# Patient Record
Sex: Male | Born: 1975
Health system: Southern US, Community
[De-identification: ages and names within clinical notes are randomized; demographics above are authoritative.]

## PROBLEM LIST (undated history)

## (undated) DIAGNOSIS — S29011A Strain of muscle and tendon of front wall of thorax, initial encounter: Secondary | ICD-10-CM

## (undated) DIAGNOSIS — T8859XA Other complications of anesthesia, initial encounter: Secondary | ICD-10-CM

## (undated) DIAGNOSIS — A4902 Methicillin resistant Staphylococcus aureus infection, unspecified site: Secondary | ICD-10-CM

## (undated) DIAGNOSIS — I10 Essential (primary) hypertension: Secondary | ICD-10-CM

## (undated) DIAGNOSIS — E119 Type 2 diabetes mellitus without complications: Secondary | ICD-10-CM

## (undated) DIAGNOSIS — G7000 Myasthenia gravis without (acute) exacerbation: Secondary | ICD-10-CM

## (undated) DIAGNOSIS — T4145XA Adverse effect of unspecified anesthetic, initial encounter: Secondary | ICD-10-CM

## (undated) HISTORY — DX: Essential (primary) hypertension: I10

## (undated) HISTORY — DX: Type 2 diabetes mellitus without complications: E11.9

---

## 1996-03-08 HISTORY — PX: THYMECTOMY: SHX1063

## 1998-06-09 ENCOUNTER — Ambulatory Visit (HOSPITAL_COMMUNITY): Admission: RE | Admit: 1998-06-09 | Discharge: 1998-06-09 | Payer: Self-pay | Admitting: Psychiatry

## 1998-07-24 ENCOUNTER — Ambulatory Visit (HOSPITAL_COMMUNITY): Admission: RE | Admit: 1998-07-24 | Discharge: 1998-07-24 | Payer: Self-pay | Admitting: Psychiatry

## 2002-07-25 ENCOUNTER — Ambulatory Visit (HOSPITAL_COMMUNITY): Admission: RE | Admit: 2002-07-25 | Discharge: 2002-07-25 | Payer: Self-pay | Admitting: Neurology

## 2003-07-02 ENCOUNTER — Ambulatory Visit (HOSPITAL_COMMUNITY): Admission: RE | Admit: 2003-07-02 | Discharge: 2003-07-02 | Payer: Self-pay | Admitting: Neurology

## 2004-08-25 ENCOUNTER — Emergency Department (HOSPITAL_COMMUNITY): Admission: EM | Admit: 2004-08-25 | Discharge: 2004-08-25 | Payer: Self-pay | Admitting: Emergency Medicine

## 2004-10-01 ENCOUNTER — Emergency Department (HOSPITAL_COMMUNITY): Admission: EM | Admit: 2004-10-01 | Discharge: 2004-10-01 | Payer: Self-pay | Admitting: Emergency Medicine

## 2005-02-20 ENCOUNTER — Emergency Department (HOSPITAL_COMMUNITY): Admission: EM | Admit: 2005-02-20 | Discharge: 2005-02-20 | Payer: Self-pay | Admitting: Family Medicine

## 2005-02-21 ENCOUNTER — Emergency Department (HOSPITAL_COMMUNITY): Admission: AD | Admit: 2005-02-21 | Discharge: 2005-02-21 | Payer: Self-pay | Admitting: Family Medicine

## 2007-07-26 ENCOUNTER — Encounter: Admission: RE | Admit: 2007-07-26 | Discharge: 2007-09-20 | Payer: Self-pay | Admitting: Occupational Medicine

## 2007-07-27 ENCOUNTER — Emergency Department (HOSPITAL_COMMUNITY): Admission: EM | Admit: 2007-07-27 | Discharge: 2007-07-28 | Payer: Self-pay | Admitting: Emergency Medicine

## 2007-09-06 ENCOUNTER — Ambulatory Visit (HOSPITAL_COMMUNITY): Admission: RE | Admit: 2007-09-06 | Discharge: 2007-09-06 | Payer: Self-pay | Admitting: Internal Medicine

## 2007-11-06 ENCOUNTER — Encounter
Admission: RE | Admit: 2007-11-06 | Discharge: 2008-02-04 | Payer: Self-pay | Admitting: Physical Medicine & Rehabilitation

## 2007-11-07 ENCOUNTER — Ambulatory Visit: Payer: Self-pay | Admitting: Physical Medicine & Rehabilitation

## 2007-11-10 ENCOUNTER — Ambulatory Visit (HOSPITAL_COMMUNITY)
Admission: RE | Admit: 2007-11-10 | Discharge: 2007-11-10 | Payer: Self-pay | Admitting: Physical Medicine & Rehabilitation

## 2007-12-01 ENCOUNTER — Encounter
Admission: RE | Admit: 2007-12-01 | Discharge: 2008-02-29 | Payer: Self-pay | Admitting: Physical Medicine & Rehabilitation

## 2007-12-06 ENCOUNTER — Ambulatory Visit: Payer: Self-pay | Admitting: Physical Medicine & Rehabilitation

## 2007-12-25 ENCOUNTER — Ambulatory Visit: Payer: Self-pay | Admitting: Physical Medicine & Rehabilitation

## 2008-01-15 ENCOUNTER — Ambulatory Visit: Payer: Self-pay | Admitting: Physical Medicine & Rehabilitation

## 2008-02-19 ENCOUNTER — Encounter
Admission: RE | Admit: 2008-02-19 | Discharge: 2008-02-19 | Payer: Self-pay | Admitting: Physical Medicine & Rehabilitation

## 2008-02-19 ENCOUNTER — Ambulatory Visit: Payer: Self-pay | Admitting: Physical Medicine & Rehabilitation

## 2008-03-08 DIAGNOSIS — A4902 Methicillin resistant Staphylococcus aureus infection, unspecified site: Secondary | ICD-10-CM

## 2008-03-08 HISTORY — DX: Methicillin resistant Staphylococcus aureus infection, unspecified site: A49.02

## 2008-09-03 ENCOUNTER — Ambulatory Visit (HOSPITAL_COMMUNITY): Admission: RE | Admit: 2008-09-03 | Discharge: 2008-09-03 | Payer: Self-pay | Admitting: Gastroenterology

## 2009-01-16 ENCOUNTER — Emergency Department (HOSPITAL_COMMUNITY): Admission: EM | Admit: 2009-01-16 | Discharge: 2009-01-16 | Payer: Self-pay | Admitting: Family Medicine

## 2009-01-18 ENCOUNTER — Emergency Department (HOSPITAL_COMMUNITY): Admission: EM | Admit: 2009-01-18 | Discharge: 2009-01-18 | Payer: Self-pay | Admitting: Family Medicine

## 2010-06-10 LAB — DIFFERENTIAL
Basophils Absolute: 0 10*3/uL (ref 0.0–0.1)
Basophils Relative: 0 % (ref 0–1)
Eosinophils Relative: 0 % (ref 0–5)
Monocytes Absolute: 0.9 10*3/uL (ref 0.1–1.0)

## 2010-06-10 LAB — CULTURE, ROUTINE-ABSCESS

## 2010-06-10 LAB — POCT URINALYSIS DIP (DEVICE)
Hgb urine dipstick: NEGATIVE
Nitrite: NEGATIVE
Protein, ur: 30 mg/dL — AB
pH: 6 (ref 5.0–8.0)

## 2010-06-10 LAB — CBC
HCT: 43.5 % (ref 39.0–52.0)
Hemoglobin: 14.4 g/dL (ref 13.0–17.0)
MCHC: 33 g/dL (ref 30.0–36.0)
RDW: 14.6 % (ref 11.5–15.5)

## 2010-07-21 NOTE — Procedures (Signed)
Grant Hernandez, Grant Hernandez                ACCOUNT NO.:  [id number]   MEDICAL RECORD NO.:  [id number]         PATIENT TYPE:  [facility]   LOCATION:                                 FACILITY:   PHYSICIAN:  Erick Colace, M.D.DATE OF BIRTH:  1975/03/27   DATE OF PROCEDURE:  DATE OF DISCHARGE:                               OPERATIVE REPORT   PROCEDURE:  Left paramedian L4-5 translaminar lumbar epidural steroid  injection under fluoroscopic guidance.   INDICATION:  Left L5 lumbar radiculopathy.  Pain is only partially  response to medication management including NSAIDs and Neurontin and  interferes with self-care mobility.  Oswestry disability scale is 33%.   Informed consent was obtained after describing risks and benefits of the  procedure with the patient.  These include bleeding, bruising,  infection, temporary or permanent paralysis, he elects to proceed and  has given consent.  The patient was placed prone on fluoroscopy table.  Betadine prep, sterile drape.  A 25-gauge 1-1/2 needle was used to  anesthetize skin and subcu tissue, 1% lidocaine x2 mL.  Then, an 18-  gauge Houston needle was inserted under fluoroscopic guidance AP and  lateral imaging.  Once posterior elements seemed to have countered the  tip of the needle on lateral projection, switched to loss-of-resistance  technique.  This was obtained at approximately 8 cm and negative heme,  negative CSF followed by injection of Omnipaque 180 under live fluoro x1  of 0.5 mL standard solution containing 2 mL of 1% lidocaine MPF plus 2  mL of 40 mg/mL Depo-Medrol injected.  The patient tolerated the  procedure well.  Pre and post injection vitals stable.  Return in 3  weeks for reinjection.      Erick Colace, M.D.  Electronically Signed     AEK/MEDQ  D:  12/25/2007 10:49:27  T:  12/26/2007 00:42:01  Job:  [id number]   cc:   Ranelle Oyster, M.D.  Fax: [phone]   Sharlet Salina, M.D.  Fax: [phone]   Elizebeth Brooking

## 2010-07-21 NOTE — Assessment & Plan Note (Signed)
Grant Hernandez is back regarding his low back pain.  He has had good results  with the lumbar epidural steroid injections done by Dr. Wynn Banker to the  L4-L5 level.  His Oswestry rating was 12% today.  He rates his pain  overall 2/10.  Pain interferes with general activity, relationship with  others, and enjoyment of life on a minimal level.  He is working full  time.  He is sleeping fairly well.  He has had a mild headache after the  injection, which was done a month ago.  He uses ibuprofen 200 and 400 mg  for symptoms.  This seems to work.  Pain seems to be in his cervical and  occipital region.   REVIEW OF SYSTEMS:  Notable for the above.  Full review is in the  written health and history section of the chart.   SOCIAL HISTORY:  As noted above, no new changes noted today.   PHYSICAL EXAMINATION:  VITAL SIGNS:  Blood pressure is 144/79, pulse 82,  respiratory rate 18, and he is sating 97% on room air.  GENERAL:  The patient is pleasant, alert, and oriented x3.  Affect is  bright and appropriate.  EXTREMITIES:  Gait is stable.  He flexes, extends, rotates, and bends at  the lumbar spine without restrictions today.  He is able to touch his  toes, which is a big change.  His strength is 5/5 with normal sensory  function.  Reflexes are 2+.  He had normal range of motion in the  cervical spine with good reflexes and 5/5 strength in the arms.  Cognitively, he is appropriate.  HEART:  Regular.  CHEST:  Clear.  ABDOMEN:  Soft and nontender.   ASSESSMENT:  1. Lumbar disk disease at L4-L5 with left paracentral disk extrusion      and annular tear.  The patient underwent two L4-L5 paracentral      translaminar injections by Dr. Wynn Banker with excellent results.  2. History of myasthenia gravis.   PLAN:  1. Recommend aggressive home exercise program with things he has been      tolerating including postural exercises and strengthening and      stretching exercises for his back as well.  He  needs to really      pursue these aggressively going ahead, and integrate exercises as      possible.  Recommended appropriate posture at work, Catering manager.  He needs      to work on weight loss as well.  2. We will see him back here as needed in the future.  He has made      nice gains.      Ranelle Oyster, M.D.  Electronically Signed     ZTS/MedQ  D:  02/19/2008 16:25:57  T:  02/20/2008 08:01:03  Job #:  [id number]   cc:   Sharlet Salina, M.D.  Fax: [phone]   Elizebeth Brooking, M.D.

## 2010-07-21 NOTE — Assessment & Plan Note (Signed)
Grant Hernandez is here regarding his back and leg pain.  An MRI of his back  performed showed a L4-L5 left paracentral disk extrusion with tear and  likely its impact upon the left L5 nerve root.  He has had about 10%  improvement with Mobic and Neurontin at this point.  He rates his pain a  6/10.  He describes it as tingling and aching, starting in the low back  and radiating down the posterolateral leg.  His pain is worse with  general activity and progresses with continued sitting and standing.  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  He has stopped  working as of this point.   REVIEW OF SYSTEMS:  Notable for the above.  He denies depression and  anxiety.  Sleep has been fair.  Full 14-point review is in the written  health and history section.   SOCIAL HISTORY:  As noted above.  He is married with 3 children.   PHYSICAL EXAMINATION:  VITAL SIGNS:  Blood pressure is 117/61, pulse is  64, respiratory rate 18, and he is sating 98% on room air.  GENERAL:  The patient is pleasant, alert and oriented x3.  Affect is  bright and appropriate.  Gait is normal today.  He had pain with flexion  at about 80 degrees.  Pain was in the back and buttock radiating down  into the left leg.  Straight leg raise is positive.  The patient had  palpable tenderness along the lower lumbar spine and paraspinal segments  on the left.  He also had pain in the left gluteal/piriformis region.  Sensory exam grossly intact.  Reflexes are 2+.  He remains large frame  and overweight.  HEART:  Regular.  CHEST:  Clear.  ABDOMEN:  Soft and nontender.  Cognition is intact.   ASSESSMENT:  1. Persistent low back pain with radiculitis likely related to L5      nerve root involvement due to L4-L5 paracentral disk extrusion and      annular tear.  2. Myasthenia gravis.   PLAN:  1. We will send the patient to Dr. Wynn Banker for left paracentral L4-      L5 interlaminar steroid injection.  2. We will increase  the Neurontin over 2 weeks' time to 300 mg t.i.d.  3. Continue Mobic 15 mg every morning.  4. After injections, which I hope will be helpful, we will focus on      appropriate core muscle strengthening, lengthening, posture, etc.      He will also need to work on some weight loss as well.  5. I will see him pending the above.      Ranelle Oyster, M.D.  Electronically Signed     ZTS/MedQ  D:  12/06/2007 09:33:15  T:  12/07/2007 00:03:59  Job #:  [id number]   cc:   Sharlet Salina, M.D.  Fax: [phone]

## 2010-07-21 NOTE — Procedures (Signed)
Grant Hernandez, Grant Hernandez                ACCOUNT NO.:  [id number]   MEDICAL RECORD NO.:  [id number]           PATIENT TYPE:   LOCATION:                                 FACILITY:   PHYSICIAN:  Erick Colace, M.D.DATE OF BIRTH:  [birth year]   DATE OF PROCEDURE:  01/15/2008  DATE OF DISCHARGE:                               OPERATIVE REPORT   PROCEDURE:  This is a left L4-L5 paramedian translaminar lumbar epidural  steroid injection under fluoroscopic guidance.   INDICATIONS:  Left L5 lumbar radiculopathy.  Pain is only partially  responsive to medication management including NSAIDs and Neurontin that  interferes with self-care and mobility.  Pain did respond to last  injection.  Affects are starting to wear off.  Oswestry index last visit  was 33%, this visit 14%.   Informed consent was obtained after describing risks and benefits of the  procedure with the patient.  These include bleeding, bruising,  infection, temporary or permanent paralysis.  He elects to proceed and  has given written consent.  The patient was placed prone on the  fluoroscopy table.  Betadine prep, sterile drape, 25-gauge 1-1/2-inch  needle was used to anesthetize the skin and subcu tissues with 1%  lidocaine x2 mL.  Then, an 18-gauge Houston needle was inserted  targeting the L4-L5 interlaminar space left paramedian approach.  AP,  lateral, and oblique imaging utilized.  Omnipaque 180 under live fluoro  demonstrated good epidural spread followed by injection of 2 mL of 1%  MPF lidocaine and 2 mL of 40 mg/mL Depo-Medrol.  The patient tolerated  the procedure well.  Pre and post injection vitals stable.  Postinjection instructions given.  I will see back p.r.n. should his  radicular pain recur.  He will follow up with Dr. Riley Kill.      Erick Colace, M.D.  Electronically Signed     AEK/MEDQ  D:  01/15/2008 14:20:36  T:  01/16/2008 33:29:51  Job:  [id number]   cc:   Elizebeth Brooking, MD   Sharlet Salina,  M.D.  Fax: [phone]   Ranelle Oyster, M.D.  Fax: [phone]

## 2010-12-02 LAB — DIFFERENTIAL
Eosinophils Absolute: 0
Lymphs Abs: 1.1
Monocytes Relative: 11
Neutrophils Relative %: 78 — ABNORMAL HIGH

## 2010-12-02 LAB — COMPREHENSIVE METABOLIC PANEL
ALT: 24
AST: 21
Calcium: 9.6
GFR calc Af Amer: >60
Glucose, Bld: 103 — ABNORMAL HIGH
Sodium: 136
Total Protein: 7.7

## 2010-12-02 LAB — CBC
MCHC: 34
Platelets: 216
RDW: 14.5

## 2010-12-02 LAB — URINALYSIS, ROUTINE W REFLEX MICROSCOPIC
Bilirubin Urine: NEGATIVE
Ketones, ur: NEGATIVE
Nitrite: NEGATIVE
Protein, ur: NEGATIVE
Urobilinogen, UA: 1

## 2011-08-17 DIAGNOSIS — G7000 Myasthenia gravis without (acute) exacerbation: Secondary | ICD-10-CM | POA: Insufficient documentation

## 2012-08-04 ENCOUNTER — Emergency Department (HOSPITAL_COMMUNITY)
Admission: EM | Admit: 2012-08-04 | Discharge: 2012-08-04 | Disposition: A | Discharge status: Home / Self Care | Payer: 59 | Source: Home / Self Care | Attending: Emergency Medicine | Admitting: Emergency Medicine

## 2012-08-04 ENCOUNTER — Encounter (HOSPITAL_COMMUNITY): Payer: Self-pay | Admitting: Emergency Medicine

## 2012-08-04 DIAGNOSIS — L0291 Cutaneous abscess, unspecified: Secondary | ICD-10-CM

## 2012-08-04 MED ORDER — SULFAMETHOXAZOLE-TMP DS 800-160 MG PO TABS: 2.0000 | ORAL_TABLET | Freq: Two times a day (BID) | ORAL | Status: DC

## 2012-08-04 MED ORDER — MUPIROCIN 2 % EX OINT: TOPICAL_OINTMENT | CUTANEOUS | Status: DC

## 2012-08-04 NOTE — ED Provider Notes (Signed)
Chief Complaint:   Chief Complaint  Patient presents with  . Recurrent Skin Infections    boil under right axillary. x 2 days    History of Present Illness:    Grant Hernandez is a 37 year old male who has had a two-day history of a boil in his right axilla. This is opened up on his own and been draining. He has had boils they're elsewhere in the past. No history of MRSA. No fever or chills.  Review of Systems:  Other than noted above, the patient denies any of the following symptoms: Systemic:  No fever, chills or sweats. Skin:  No rash or itching.  PMFSH:  Past medical history, family history, social history, meds, and allergies were reviewed.  No history of diabetes or prior history of MRSA.   Physical Exam:   Vital signs:  BP 161/81  Pulse 64  Temp(Src) 97.9 F (36.6 C) (Oral)  Resp 16  SpO2 100% Skin:  There is a 2 cm abscess in his right axilla. This has opened up and is draining on its own. Right now there is healthy looking granulation tissue, no fluctuance, no induration, and only minimal tenderness to palpation.  Skin exam was otherwise normal.  No rash. Ext:  Distal pulses were full, patient has full ROM of all joints.  Procedure:  Verbal informed consent was obtained.  The patient was informed of the risks and benefits of the procedure and understands and accepts.  Identity of the patient was verified verbally and by wristband. The abscess area was cultured, then cleansed with saline, antibiotic ointment was applied and a sterile dressing.  Assessment:  The encounter diagnosis was Abscess.  This appears to opened up on its own and did not require incision and drainage. We did obtain a culture of the open area. Since this has occurred previously he was placed on mupirocin to the nostrils for one month.  Plan:   1.  The following meds were prescribed:   Discharge Medication List as of 08/04/2012  6:31 PM    START taking these medications   Details  mupirocin ointment  (BACTROBAN) 2 % Apply to both nostrils BID for 1 month., Normal    sulfamethoxazole-trimethoprim (BACTRIM DS) 800-160 MG per tablet Take 2 tablets by mouth 2 (two) times daily., Starting 08/04/2012, Until Discontinued, Normal       2.  The patient was instructed in symptomatic care and handouts were given. 3.  The patient was instructed to leave the dressing in place and return again in 48 hours for packing removal.  Given red flag symptoms such as fever or worsening pain that would indicate earlier return.   Reuben Likes, MD 08/04/12 2016

## 2012-08-04 NOTE — ED Notes (Signed)
Pt c/o boil under right axillary x 2 days Pt states that area started to drain last night.  Pt denies fever and any other symptoms.  Pt has kept area clean and bandaged

## 2012-08-07 LAB — CULTURE, ROUTINE-ABSCESS
Gram Stain: NONE SEEN
Special Requests: NORMAL

## 2012-08-16 NOTE — ED Notes (Signed)
Abscess culture R axilla: few Proteus Mirabilis.  Pt. adequately treated with Bactrim DS. Vassie Moselle 08/16/2012

## 2013-02-15 ENCOUNTER — Other Ambulatory Visit (HOSPITAL_COMMUNITY): Payer: Self-pay | Admitting: *Deleted

## 2013-02-15 DIAGNOSIS — R51 Headache: Secondary | ICD-10-CM

## 2013-02-23 ENCOUNTER — Ambulatory Visit (HOSPITAL_COMMUNITY)
Admission: RE | Admit: 2013-02-23 | Discharge: 2013-02-23 | Disposition: A | Discharge status: Home / Self Care | Payer: 59 | Source: Ambulatory Visit | Attending: Psychiatry | Admitting: Psychiatry

## 2013-02-23 DIAGNOSIS — R51 Headache: Secondary | ICD-10-CM | POA: Insufficient documentation

## 2013-02-23 MED ORDER — GADOBENATE DIMEGLUMINE 529 MG/ML IV SOLN
20.0000 mL | Freq: Once | INTRAVENOUS | Status: AC | PRN
  Administered 2013-02-23: 20 mL via INTRAVENOUS

## 2014-10-30 ENCOUNTER — Encounter: Payer: Self-pay | Admitting: *Deleted

## 2014-10-30 DIAGNOSIS — Z006 Encounter for examination for normal comparison and control in clinical research program: Secondary | ICD-10-CM

## 2014-10-30 NOTE — Progress Notes (Signed)
STATUS FIRST Informed Consent      Subject Name: Grant Hernandez   Subject met inclusion and exclusion criteria.The informed consent form, study requirements and expectations were reviewed with the subject and questions and concerns were addressed prior to the signing of the consent form. The subject verbalized understanding of the trail requirements. The subject agreed to participate in the STATUS FIRST research study and signed the informed consent at 08:28 on 10-30-14.The informed consent was obtained prior to performance of any protocol-specific procedures for the subject. A copy of the signed informed consent was given to the subject and a copy was placed in the subjects's medical record.    Grant Hernandez 10/30/14 08:28

## 2015-03-17 MED FILL — PYRIDOSTIGMINE BR 60 MG TAB: 60 | 88 days supply | Qty: 400 | Fill #0

## 2015-03-17 MED FILL — MYCOPHENOLATE 250 MG CAPSUL: 250 | 90 days supply | Qty: 540 | Fill #0

## 2015-03-18 MED FILL — TOPIRAMATE 25 MG TABLET: 25 | 30 days supply | Qty: 90 | Fill #0

## 2015-03-18 MED FILL — RIZATRIPTAN 10 MG TABLET: 10 | 30 days supply | Qty: 9 | Fill #0

## 2015-06-19 DIAGNOSIS — G44219 Episodic tension-type headache, not intractable: Secondary | ICD-10-CM | POA: Diagnosis not present

## 2015-06-19 DIAGNOSIS — G43009 Migraine without aura, not intractable, without status migrainosus: Secondary | ICD-10-CM | POA: Diagnosis not present

## 2015-06-23 MED FILL — RIZATRIPTAN 10 MG TABLET: 10 | 30 days supply | Qty: 9 | Fill #0

## 2015-06-23 MED FILL — TOPIRAMATE 100 MG TABLET: 100 | 30 days supply | Qty: 30 | Fill #0

## 2015-08-06 MED FILL — PYRIDOSTIGMINE BR 60 MG TAB: 60 | 60 days supply | Qty: 300 | Fill #1

## 2015-08-12 DIAGNOSIS — M542 Cervicalgia: Secondary | ICD-10-CM | POA: Diagnosis not present

## 2015-08-15 DIAGNOSIS — M542 Cervicalgia: Secondary | ICD-10-CM | POA: Diagnosis not present

## 2015-08-18 DIAGNOSIS — M542 Cervicalgia: Secondary | ICD-10-CM | POA: Diagnosis not present

## 2015-08-21 DIAGNOSIS — M542 Cervicalgia: Secondary | ICD-10-CM | POA: Diagnosis not present

## 2015-10-16 MED FILL — PYRIDOSTIGMINE BR 60 MG TAB: 60 | 60 days supply | Qty: 300 | Fill #0

## 2015-10-29 DIAGNOSIS — Z006 Encounter for examination for normal comparison and control in clinical research program: Secondary | ICD-10-CM | POA: Diagnosis not present

## 2015-10-29 DIAGNOSIS — G7000 Myasthenia gravis without (acute) exacerbation: Secondary | ICD-10-CM | POA: Diagnosis not present

## 2015-10-29 DIAGNOSIS — Z79899 Other long term (current) drug therapy: Secondary | ICD-10-CM | POA: Diagnosis not present

## 2015-11-11 MED FILL — MYCOPHENOLATE 500 MG TABLET: 500 | 90 days supply | Qty: 360 | Fill #0

## 2015-11-11 MED FILL — MYCOPHENOLATE 250 MG CAPSUL: 250 | 90 days supply | Qty: 180 | Fill #0

## 2015-11-14 DIAGNOSIS — Z79899 Other long term (current) drug therapy: Secondary | ICD-10-CM | POA: Diagnosis not present

## 2015-11-14 DIAGNOSIS — R7309 Other abnormal glucose: Secondary | ICD-10-CM | POA: Diagnosis not present

## 2015-11-14 DIAGNOSIS — G7000 Myasthenia gravis without (acute) exacerbation: Secondary | ICD-10-CM | POA: Diagnosis not present

## 2015-11-14 DIAGNOSIS — E559 Vitamin D deficiency, unspecified: Secondary | ICD-10-CM | POA: Diagnosis not present

## 2015-11-14 DIAGNOSIS — Z0000 Encounter for general adult medical examination without abnormal findings: Secondary | ICD-10-CM | POA: Diagnosis not present

## 2015-12-04 MED FILL — PYRIDOSTIGMINE BR 60 MG TAB: 60 | 90 days supply | Qty: 405 | Fill #0

## 2015-12-15 DIAGNOSIS — G44219 Episodic tension-type headache, not intractable: Secondary | ICD-10-CM | POA: Diagnosis not present

## 2015-12-15 DIAGNOSIS — G43009 Migraine without aura, not intractable, without status migrainosus: Secondary | ICD-10-CM | POA: Diagnosis not present

## 2016-03-31 MED FILL — PYRIDOSTIGMINE BR 60 MG TAB: 60 | 90 days supply | Qty: 405 | Fill #1

## 2016-05-27 MED FILL — MYCOPHENOLATE 250 MG CAP: 250 | 90 days supply | Qty: 180 | Fill #1

## 2016-05-27 MED FILL — MYCOPHENOLATE 500 MG TABLET: 500 | 90 days supply | Qty: 360 | Fill #1

## 2016-07-23 DIAGNOSIS — G44219 Episodic tension-type headache, not intractable: Secondary | ICD-10-CM | POA: Diagnosis not present

## 2016-07-23 DIAGNOSIS — G4453 Primary thunderclap headache: Secondary | ICD-10-CM | POA: Diagnosis not present

## 2016-07-23 DIAGNOSIS — G43009 Migraine without aura, not intractable, without status migrainosus: Secondary | ICD-10-CM | POA: Diagnosis not present

## 2016-07-29 ENCOUNTER — Other Ambulatory Visit (HOSPITAL_COMMUNITY): Payer: Self-pay | Admitting: Neurology

## 2016-07-29 DIAGNOSIS — G4453 Primary thunderclap headache: Secondary | ICD-10-CM

## 2016-07-29 DIAGNOSIS — I671 Cerebral aneurysm, nonruptured: Secondary | ICD-10-CM

## 2016-08-04 ENCOUNTER — Other Ambulatory Visit: Payer: Self-pay | Admitting: Neurology

## 2016-08-04 ENCOUNTER — Ambulatory Visit (HOSPITAL_COMMUNITY)
Admission: EM | Admit: 2016-08-04 | Discharge: 2016-08-04 | Disposition: A | Discharge status: Other Acute Inpatient Hospital | Payer: 59

## 2016-08-04 DIAGNOSIS — R51 Headache: Principal | ICD-10-CM

## 2016-08-04 DIAGNOSIS — R519 Headache, unspecified: Secondary | ICD-10-CM

## 2016-08-05 ENCOUNTER — Other Ambulatory Visit: Payer: Self-pay | Admitting: Sports Medicine

## 2016-08-05 ENCOUNTER — Ambulatory Visit
Admission: RE | Admit: 2016-08-05 | Discharge: 2016-08-05 | Disposition: A | Discharge status: Home / Self Care | Payer: 59 | Source: Ambulatory Visit | Attending: Sports Medicine | Admitting: Sports Medicine

## 2016-08-05 DIAGNOSIS — S29011A Strain of muscle and tendon of front wall of thorax, initial encounter: Secondary | ICD-10-CM | POA: Diagnosis not present

## 2016-08-09 ENCOUNTER — Encounter (HOSPITAL_BASED_OUTPATIENT_CLINIC_OR_DEPARTMENT_OTHER): Payer: Self-pay | Admitting: *Deleted

## 2016-08-09 DIAGNOSIS — S29011A Strain of muscle and tendon of front wall of thorax, initial encounter: Secondary | ICD-10-CM | POA: Diagnosis not present

## 2016-08-10 ENCOUNTER — Encounter (HOSPITAL_BASED_OUTPATIENT_CLINIC_OR_DEPARTMENT_OTHER): Payer: Self-pay

## 2016-08-12 ENCOUNTER — Ambulatory Visit (HOSPITAL_BASED_OUTPATIENT_CLINIC_OR_DEPARTMENT_OTHER): Payer: 59 | Admitting: Anesthesiology

## 2016-08-12 ENCOUNTER — Encounter (HOSPITAL_BASED_OUTPATIENT_CLINIC_OR_DEPARTMENT_OTHER)
Admission: RE | Disposition: A | Discharge status: Home / Self Care | Payer: Self-pay | Source: Ambulatory Visit | Attending: Orthopedic Surgery

## 2016-08-12 ENCOUNTER — Ambulatory Visit (HOSPITAL_BASED_OUTPATIENT_CLINIC_OR_DEPARTMENT_OTHER)
Admission: RE | Admit: 2016-08-12 | Discharge: 2016-08-12 | Disposition: A | Discharge status: Home / Self Care | Payer: 59 | Source: Ambulatory Visit | Attending: Orthopedic Surgery | Admitting: Orthopedic Surgery

## 2016-08-12 ENCOUNTER — Encounter (HOSPITAL_BASED_OUTPATIENT_CLINIC_OR_DEPARTMENT_OTHER): Payer: Self-pay | Admitting: Anesthesiology

## 2016-08-12 DIAGNOSIS — Z8614 Personal history of Methicillin resistant Staphylococcus aureus infection: Secondary | ICD-10-CM | POA: Diagnosis not present

## 2016-08-12 DIAGNOSIS — S46811A Strain of other muscles, fascia and tendons at shoulder and upper arm level, right arm, initial encounter: Secondary | ICD-10-CM | POA: Diagnosis not present

## 2016-08-12 DIAGNOSIS — X500XXA Overexertion from strenuous movement or load, initial encounter: Secondary | ICD-10-CM | POA: Insufficient documentation

## 2016-08-12 DIAGNOSIS — M66821 Spontaneous rupture of other tendons, right upper arm: Secondary | ICD-10-CM | POA: Diagnosis not present

## 2016-08-12 DIAGNOSIS — S46901A Unspecified injury of unspecified muscle, fascia and tendon at shoulder and upper arm level, right arm, initial encounter: Secondary | ICD-10-CM | POA: Diagnosis not present

## 2016-08-12 DIAGNOSIS — X503XXA Overexertion from repetitive movements, initial encounter: Secondary | ICD-10-CM | POA: Diagnosis not present

## 2016-08-12 DIAGNOSIS — Y9359 Activity, other involving other sports and athletics played individually: Secondary | ICD-10-CM | POA: Diagnosis not present

## 2016-08-12 DIAGNOSIS — G7000 Myasthenia gravis without (acute) exacerbation: Secondary | ICD-10-CM | POA: Diagnosis not present

## 2016-08-12 DIAGNOSIS — Z9889 Other specified postprocedural states: Secondary | ICD-10-CM | POA: Insufficient documentation

## 2016-08-12 HISTORY — DX: Methicillin resistant Staphylococcus aureus infection, unspecified site: A49.02

## 2016-08-12 HISTORY — DX: Myasthenia gravis without (acute) exacerbation: G70.00

## 2016-08-12 HISTORY — PX: PECTORALIS TENDON REPAIR: SHX6510

## 2016-08-12 HISTORY — DX: Adverse effect of unspecified anesthetic, initial encounter: T41.45XA

## 2016-08-12 HISTORY — DX: Other complications of anesthesia, initial encounter: T88.59XA

## 2016-08-12 HISTORY — DX: Strain of muscle and tendon of front wall of thorax, initial encounter: S29.011A

## 2016-08-12 SURGERY — REPAIR, TENDON, PECTORALIS
Anesthesia: General | Site: Shoulder | Laterality: Right

## 2016-08-12 MED ORDER — FENTANYL CITRATE (PF) 100 MCG/2ML IJ SOLN
INTRAMUSCULAR | Status: AC
  Filled 2016-08-12: qty 2

## 2016-08-12 MED ORDER — VANCOMYCIN HCL IN DEXTROSE 1-5 GM/200ML-% IV SOLN
INTRAVENOUS | Status: AC
Start: 2016-08-12 — End: 2016-08-12
  Filled 2016-08-12: qty 200

## 2016-08-12 MED ORDER — VANCOMYCIN HCL 1000 MG IV SOLR
INTRAVENOUS | Status: DC | PRN
  Administered 2016-08-12: 1500 mg via INTRAVENOUS

## 2016-08-12 MED ORDER — OXYCODONE HCL 5 MG/5ML PO SOLN: 5.0000 mg | Freq: Once | ORAL | Status: AC | PRN

## 2016-08-12 MED ORDER — EPHEDRINE SULFATE 50 MG/ML IJ SOLN
INTRAMUSCULAR | Status: DC | PRN
  Administered 2016-08-12: 15 mg via INTRAVENOUS

## 2016-08-12 MED ORDER — HYDROMORPHONE HCL 1 MG/ML IJ SOLN
INTRAMUSCULAR | Status: AC
  Filled 2016-08-12: qty 0.5

## 2016-08-12 MED ORDER — BACLOFEN 10 MG PO TABS: 10.0000 mg | ORAL_TABLET | Freq: Three times a day (TID) | ORAL | 0 refills | Status: DC

## 2016-08-12 MED ORDER — MIDAZOLAM HCL 2 MG/2ML IJ SOLN: 1.0000 mg | INTRAMUSCULAR | Status: DC | PRN

## 2016-08-12 MED ORDER — KETOROLAC TROMETHAMINE 30 MG/ML IJ SOLN
INTRAMUSCULAR | Status: AC
  Filled 2016-08-12: qty 1

## 2016-08-12 MED ORDER — LIDOCAINE HCL (CARDIAC) 20 MG/ML IV SOLN
INTRAVENOUS | Status: DC | PRN
  Administered 2016-08-12: 30 mg via INTRAVENOUS

## 2016-08-12 MED ORDER — MIDAZOLAM HCL 5 MG/5ML IJ SOLN
INTRAMUSCULAR | Status: DC | PRN
  Administered 2016-08-12: 2 mg via INTRAVENOUS

## 2016-08-12 MED ORDER — ACETAMINOPHEN 10 MG/ML IV SOLN
INTRAVENOUS | Status: AC
  Filled 2016-08-12: qty 100

## 2016-08-12 MED ORDER — KETOROLAC TROMETHAMINE 30 MG/ML IJ SOLN
30.0000 mg | Freq: Once | INTRAMUSCULAR | Status: AC
  Administered 2016-08-12: 30 mg via INTRAVENOUS

## 2016-08-12 MED ORDER — HYDROMORPHONE HCL 1 MG/ML IJ SOLN
INTRAMUSCULAR | Status: AC
Start: 2016-08-12 — End: 2016-08-12
  Filled 2016-08-12: qty 0.5

## 2016-08-12 MED ORDER — DEXAMETHASONE SODIUM PHOSPHATE 10 MG/ML IJ SOLN
INTRAMUSCULAR | Status: AC
  Filled 2016-08-12: qty 1

## 2016-08-12 MED ORDER — SCOPOLAMINE 1 MG/3DAYS TD PT72: 1.0000 | MEDICATED_PATCH | Freq: Once | TRANSDERMAL | Status: DC | PRN

## 2016-08-12 MED ORDER — ONDANSETRON HCL 4 MG/2ML IJ SOLN
INTRAMUSCULAR | Status: DC | PRN
  Administered 2016-08-12: 4 mg via INTRAVENOUS

## 2016-08-12 MED ORDER — OXYCODONE HCL 5 MG PO TABS
5.0000 mg | ORAL_TABLET | Freq: Once | ORAL | Status: AC | PRN
  Administered 2016-08-12: 5 mg via ORAL

## 2016-08-12 MED ORDER — VANCOMYCIN HCL IN DEXTROSE 500-5 MG/100ML-% IV SOLN
INTRAVENOUS | Status: AC
  Filled 2016-08-12: qty 100

## 2016-08-12 MED ORDER — DEXAMETHASONE SODIUM PHOSPHATE 4 MG/ML IJ SOLN
INTRAMUSCULAR | Status: DC | PRN
Start: 2016-08-12 — End: 2016-08-12
  Administered 2016-08-12: 10 mg via INTRAVENOUS

## 2016-08-12 MED ORDER — FENTANYL CITRATE (PF) 100 MCG/2ML IJ SOLN
INTRAMUSCULAR | Status: DC | PRN
  Administered 2016-08-12 (×2): 25 ug via INTRAVENOUS
  Administered 2016-08-12: 100 ug via INTRAVENOUS
  Administered 2016-08-12 (×2): 25 ug via INTRAVENOUS

## 2016-08-12 MED ORDER — BUPIVACAINE HCL (PF) 0.5 % IJ SOLN
INTRAMUSCULAR | Status: AC
  Filled 2016-08-12: qty 30

## 2016-08-12 MED ORDER — CEFAZOLIN SODIUM-DEXTROSE 2-4 GM/100ML-% IV SOLN: 2.0000 g | INTRAVENOUS | Status: DC

## 2016-08-12 MED ORDER — ONDANSETRON HCL 4 MG PO TABS: 4.0000 mg | ORAL_TABLET | Freq: Three times a day (TID) | ORAL | 0 refills | Status: DC | PRN

## 2016-08-12 MED ORDER — FENTANYL CITRATE (PF) 100 MCG/2ML IJ SOLN: 50.0000 ug | INTRAMUSCULAR | Status: DC | PRN

## 2016-08-12 MED ORDER — HYDROMORPHONE HCL 1 MG/ML IJ SOLN
0.5000 mg | INTRAMUSCULAR | Status: DC | PRN
  Administered 2016-08-12: 0.5 mg via INTRAVENOUS

## 2016-08-12 MED ORDER — MEPERIDINE HCL 25 MG/ML IJ SOLN: 6.2500 mg | INTRAMUSCULAR | Status: DC | PRN

## 2016-08-12 MED ORDER — PROPOFOL 10 MG/ML IV BOLUS
INTRAVENOUS | Status: DC | PRN
  Administered 2016-08-12: 300 mg via INTRAVENOUS

## 2016-08-12 MED ORDER — OXYCODONE HCL 5 MG PO TABS
ORAL_TABLET | ORAL | Status: AC
Start: 2016-08-12 — End: 2016-08-12
  Filled 2016-08-12: qty 1

## 2016-08-12 MED ORDER — HYDROMORPHONE HCL 1 MG/ML IJ SOLN
0.2500 mg | INTRAMUSCULAR | Status: DC | PRN
  Administered 2016-08-12 (×4): 0.5 mg via INTRAVENOUS

## 2016-08-12 MED ORDER — CEFAZOLIN SODIUM-DEXTROSE 2-4 GM/100ML-% IV SOLN
INTRAVENOUS | Status: AC
  Filled 2016-08-12: qty 100

## 2016-08-12 MED ORDER — EPHEDRINE 5 MG/ML INJ
INTRAVENOUS | Status: AC
  Filled 2016-08-12: qty 10

## 2016-08-12 MED ORDER — LIDOCAINE 2% (20 MG/ML) 5 ML SYRINGE
INTRAMUSCULAR | Status: AC
  Filled 2016-08-12: qty 5

## 2016-08-12 MED ORDER — DEXTROSE 5 % IV SOLN
3.0000 g | INTRAVENOUS | Status: AC
  Administered 2016-08-12: 3 g via INTRAVENOUS

## 2016-08-12 MED ORDER — ACETAMINOPHEN 10 MG/ML IV SOLN
1000.0000 mg | Freq: Four times a day (QID) | INTRAVENOUS | Status: DC
  Administered 2016-08-12: 1000 mg via INTRAVENOUS

## 2016-08-12 MED ORDER — MIDAZOLAM HCL 2 MG/2ML IJ SOLN
INTRAMUSCULAR | Status: AC
  Filled 2016-08-12: qty 2

## 2016-08-12 MED ORDER — PROMETHAZINE HCL 25 MG/ML IJ SOLN: 6.2500 mg | INTRAMUSCULAR | Status: DC | PRN

## 2016-08-12 MED ORDER — OXYCODONE HCL 5 MG PO TABS: 5.0000 mg | ORAL_TABLET | ORAL | 0 refills | Status: DC | PRN

## 2016-08-12 MED ORDER — PROPOFOL 10 MG/ML IV BOLUS
INTRAVENOUS | Status: AC
  Filled 2016-08-12: qty 20

## 2016-08-12 MED ORDER — BUPIVACAINE HCL (PF) 0.25 % IJ SOLN
INTRAMUSCULAR | Status: AC
  Filled 2016-08-12: qty 30

## 2016-08-12 MED ORDER — CEFAZOLIN SODIUM-DEXTROSE 1-4 GM/50ML-% IV SOLN
INTRAVENOUS | Status: AC
  Filled 2016-08-12: qty 50

## 2016-08-12 MED ORDER — LACTATED RINGERS IV SOLN
INTRAVENOUS | Status: DC
  Administered 2016-08-12 (×2): via INTRAVENOUS

## 2016-08-12 MED ORDER — SENNA-DOCUSATE SODIUM 8.6-50 MG PO TABS: 2.0000 | ORAL_TABLET | Freq: Every day | ORAL | 1 refills | Status: DC

## 2016-08-12 MED ORDER — ONDANSETRON HCL 4 MG/2ML IJ SOLN
INTRAMUSCULAR | Status: AC
  Filled 2016-08-12: qty 2

## 2016-08-12 MED FILL — oxyCODONE HCL 5 MG TABS: 5 | 6 days supply | Qty: 50 | Fill #0

## 2016-08-12 SURGICAL SUPPLY — 54 items
BLADE SURG 15 STRL LF DISP TIS (BLADE) ×2 IMPLANT
BLADE SURG 15 STRL SS (BLADE) ×4
BNDG COHESIVE 3X5 TAN STRL LF (GAUZE/BANDAGES/DRESSINGS) ×2 IMPLANT
BRUSH SCRUB EZ PLAIN DRY (MISCELLANEOUS) ×2 IMPLANT
CANISTER SUCT 1200ML W/VALVE (MISCELLANEOUS) ×2 IMPLANT
CHLORAPREP W/TINT 26ML (MISCELLANEOUS) ×4 IMPLANT
CLSR STERI-STRIP ANTIMIC 1/2X4 (GAUZE/BANDAGES/DRESSINGS) ×2 IMPLANT
DECANTER SPIKE VIAL GLASS SM (MISCELLANEOUS) IMPLANT
DRAPE IMP U-DRAPE 54X76 (DRAPES) ×2 IMPLANT
DRAPE INCISE IOBAN 66X45 STRL (DRAPES) ×2 IMPLANT
DRAPE OEC MINIVIEW 54X84 (DRAPES) IMPLANT
DRAPE U-SHAPE 47X51 STRL (DRAPES) ×2 IMPLANT
DRAPE U-SHAPE 76X120 STRL (DRAPES) ×4 IMPLANT
DURAPREP 26ML APPLICATOR (WOUND CARE) IMPLANT
ELECT REM PT RETURN 9FT ADLT (ELECTROSURGICAL) ×2
ELECTRODE REM PT RTRN 9FT ADLT (ELECTROSURGICAL) ×1 IMPLANT
GAUZE SPONGE 4X4 12PLY STRL (GAUZE/BANDAGES/DRESSINGS) ×2 IMPLANT
GLOVE BIO SURGEON STRL SZ8 (GLOVE) ×2 IMPLANT
GLOVE BIOGEL M STRL SZ7.5 (GLOVE) ×2 IMPLANT
GLOVE BIOGEL PI IND STRL 8 (GLOVE) ×3 IMPLANT
GLOVE BIOGEL PI INDICATOR 8 (GLOVE) ×3
GLOVE EXAM NITRILE EXT CUFF MD (GLOVE) ×4 IMPLANT
GLOVE ORTHO TXT STRL SZ7.5 (GLOVE) ×2 IMPLANT
GOWN STRL REUS W/ TWL LRG LVL3 (GOWN DISPOSABLE) IMPLANT
GOWN STRL REUS W/ TWL XL LVL3 (GOWN DISPOSABLE) ×3 IMPLANT
GOWN STRL REUS W/TWL LRG LVL3 (GOWN DISPOSABLE)
GOWN STRL REUS W/TWL XL LVL3 (GOWN DISPOSABLE) ×6
IMPL KIT LG PECW/BUTTON 3PK (Orthopedic Implant) ×1 IMPLANT
IMPLANT KIT LG PECW/BUTTON 3PK (Orthopedic Implant) ×2 IMPLANT
NS IRRIG 1000ML POUR BTL (IV SOLUTION) ×2 IMPLANT
PACK ARTHROSCOPY DSU (CUSTOM PROCEDURE TRAY) ×2 IMPLANT
PACK BASIN DAY SURGERY FS (CUSTOM PROCEDURE TRAY) ×2 IMPLANT
PENCIL BUTTON HOLSTER BLD 10FT (ELECTRODE) ×2 IMPLANT
SLEEVE SCD COMPRESS KNEE MED (MISCELLANEOUS) ×2 IMPLANT
SLING ARM IMMOBILIZER XL (CAST SUPPLIES) ×2 IMPLANT
SPONGE LAP 18X18 X RAY DECT (DISPOSABLE) ×2 IMPLANT
SPONGE LAP 4X18 X RAY DECT (DISPOSABLE) IMPLANT
SUPPORT WRAP ARM LG (MISCELLANEOUS) ×2 IMPLANT
SUT 2 FIBERLOOP 20 STRT BLUE (SUTURE)
SUT FIBERWIRE #2 38 T-5 BLUE (SUTURE)
SUT MNCRL AB 3-0 PS2 18 (SUTURE) IMPLANT
SUT MNCRL AB 4-0 PS2 18 (SUTURE) IMPLANT
SUT VIC AB 0 CT1 27 (SUTURE) ×2
SUT VIC AB 0 CT1 27XBRD ANBCTR (SUTURE) ×1 IMPLANT
SUT VIC AB 2-0 SH 27 (SUTURE)
SUT VIC AB 2-0 SH 27XBRD (SUTURE) IMPLANT
SUT VICRYL 3-0 CR8 SH (SUTURE) ×2 IMPLANT
SUTURE 2 FIBERLOOP 20 STRT BLU (SUTURE) IMPLANT
SUTURE FIBERWR #2 38 T-5 BLUE (SUTURE) IMPLANT
SYR BULB 3OZ (MISCELLANEOUS) ×2 IMPLANT
SYR CONTROL 10ML LL (SYRINGE) IMPLANT
TOWEL OR 17X24 6PK STRL BLUE (TOWEL DISPOSABLE) ×4 IMPLANT
TOWEL OR NON WOVEN STRL DISP B (DISPOSABLE) IMPLANT
YANKAUER SUCT BULB TIP NO VENT (SUCTIONS) ×2 IMPLANT

## 2016-08-12 NOTE — Anesthesia Preprocedure Evaluation (Addendum)
Anesthesia Evaluation  Patient identified by MRN, date of birth, ID band Patient awake    Reviewed: Allergy & Precautions, NPO status , Patient's Chart, lab work & pertinent test results  Airway Mallampati: II  TM Distance: >3 FB Neck ROM: Full    Dental no notable dental hx.    Pulmonary neg pulmonary ROS,    Pulmonary exam normal breath sounds clear to auscultation       Cardiovascular negative cardio ROS Normal cardiovascular exam Rhythm:Regular Rate:Normal     Neuro/Psych Myasthenia Gravis negative psych ROS   GI/Hepatic negative GI ROS, Neg liver ROS,   Endo/Other  negative endocrine ROS  Renal/GU negative Renal ROS  negative genitourinary   Musculoskeletal negative musculoskeletal ROS (+)   Abdominal   Peds negative pediatric ROS (+)  Hematology negative hematology ROS (+)   Anesthesia Other Findings   Reproductive/Obstetrics negative OB ROS                            Anesthesia Physical Anesthesia Plan  ASA: II  Anesthesia Plan: General   Post-op Pain Management:    Induction: Intravenous  PONV Risk Score and Plan: 3 and Ondansetron, Dexamethasone, Propofol, Midazolam and Treatment may vary due to age  Airway Management Planned:   Additional Equipment:   Intra-op Plan:   Post-operative Plan: Extubation in OR  Informed Consent: I have reviewed the patients History and Physical, chart, labs and discussed the procedure including the risks, benefits and alternatives for the proposed anesthesia with the patient or authorized representative who has indicated his/her understanding and acceptance.   Dental advisory given  Plan Discussed with: CRNA  Anesthesia Plan Comments:         Anesthesia Quick Evaluation

## 2016-08-12 NOTE — Transfer of Care (Signed)
Immediate Anesthesia Transfer of Care Note  Patient: Grant Hernandez  Procedure(s) Performed: Procedure(s): RIGHT PECTORALIS TENDON REPAIR (Right)  Patient Location: PACU  Anesthesia Type:General  Level of Consciousness: awake and sedated  Airway & Oxygen Therapy: Patient Spontanous Breathing and Patient connected to face mask oxygen  Post-op Assessment: Report given to RN and Post -op Vital signs reviewed and stable  Post vital signs: Reviewed and stable  Last Vitals:  Vitals:   08/12/16 1222  BP: 140/88  Pulse: 68  Resp: 18  Temp: 36.9 C    Last Pain:  Vitals:   08/12/16 1222  TempSrc: Oral      Patients Stated Pain Goal: 0 (16/10/96 0454)  Complications: No apparent anesthesia complications

## 2016-08-12 NOTE — Op Note (Signed)
08/12/2016  2:12 PM  PATIENT:  Grant Hernandez    PRE-OPERATIVE DIAGNOSIS:  right pectoralis tendon rupture  POST-OPERATIVE DIAGNOSIS:  Same  PROCEDURE:  RIGHT PECTORALIS TENDON REPAIR  SURGEON:  Johnny Bridge, MD  PHYSICIAN ASSISTANT: Joya Gaskins, OPA-C, present and scrubbed throughout the case, critical for completion in a timely fashion, and for retraction, instrumentation, and closure.  ANESTHESIA:   General  PREOPERATIVE INDICATIONS:  Grant Hernandez is a  41 y.o. male with a diagnosis of right pectoralis tendon rupture who  elected for surgical management.    The risks benefits and alternatives were discussed with the patient preoperatively including but not limited to the risks of infection, bleeding, nerve injury, cardiopulmonary complications, the need for revision surgery, among others, and the patient was willing to proceed. We also discussed the risk for recurrent tendon rupture, persistent weakness, loss of contour, particularly the risk of infection given a history of MRSA, and we are administering both vancomycin and Ancef preoperatively.   ESTIMATED BLOOD LOSS: 100 mL  OPERATIVE IMPLANTS: Arthrex pectoralis tendon repair button with fiber tape 2  OPERATIVE FINDINGS: Rupture of the pectoralis tendon with seroma formation and retraction.  OPERATIVE PROCEDURE: The patient is brought to the operating room and placed in the supine position. Gen. anesthesia was administered. IV antibiotics were given with vancomycin and Ancef.  He was placed in the beachchair position, all bony prominences padded. I prescrubbed his axilla, and then the right upper extremity prepped and draped in usual sterile fashion. I used ioban over the axilla.  Time out performed. Deltopectoral approach was carried out, and the pectoralis muscle was elevated, the fascia exposed, and the tear identified. The tear was mobilized, and then I placed a fiber tape sutures 2 through the muscular fascia and  tendon. These were brought into drill holes placed in the humerus with buttons delivered down within the humeral canal. The tension was applied, and the tendon repaired back to bone. I did prepare the bony trough with a curette as well prior to placement of the tendon. The wounds were irrigated copiously and subcutaneous tissue closed with routine fashion including the skin and Steri-Strips and sterile gauze. He was placed in a sling and awakened and returned to PACU in stable and satisfactory condition.

## 2016-08-12 NOTE — Anesthesia Postprocedure Evaluation (Signed)
Anesthesia Post Note  Patient: Grant Hernandez  Procedure(s) Performed: Procedure(s) (LRB): RIGHT PECTORALIS TENDON REPAIR (Right)     Patient location during evaluation: PACU Anesthesia Type: General Level of consciousness: awake and alert Pain management: pain level controlled Vital Signs Assessment: post-procedure vital signs reviewed and stable Respiratory status: spontaneous breathing, nonlabored ventilation and respiratory function stable Cardiovascular status: blood pressure returned to baseline and stable Postop Assessment: no signs of nausea or vomiting Anesthetic complications: no    Last Vitals:  Vitals:   08/12/16 1700 08/12/16 1715  BP: (!) 174/83 (!) 146/80  Pulse: 77 93  Resp: 16 (!) 6  Temp:      Last Pain:  Vitals:   08/12/16 1700  TempSrc:   PainSc: [place]

## 2016-08-12 NOTE — H&P (Signed)
.   PREOPERATIVE H&P  Chief Complaint: right pectoralis tendon rupture  HPI: Grant Hernandez is a 41 y.o. male who presents for preoperative history and physical with a diagnosis of right pectoralis tendon rupture. Symptoms are rated as moderate to severe, and have been worsening.  This is significantly impairing activities of daily living.  He has elected for surgical management.   This occurred while doing bench press, he is an avid weight lifter, and is currently unable to use his arm.   Past Medical History:  Diagnosis Date  . 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 MRSA (methicillin resistant Staphylococcus aureus) 2010   right axilla  . Myasthenia gravis ([place])   . Pectoralis muscle rupture    Past Surgical History:  Procedure Laterality Date  . THYMECTOMY  1998   Social History   Social History  . Marital status: Married    Spouse name: N/A  . Number of children: N/A  . Years of education: N/A   Social History Main Topics  . Smoking status: Never Smoker  . Smokeless tobacco: Never Used  . Alcohol use Yes     Comment: social  . Drug use: No  . Sexual activity: Yes    Birth control/ protection: Condom   Other Topics Concern  . None   Social History Narrative  . None   History reviewed. No pertinent family history. No Known Allergies Prior to Admission medications   Medication Sig Start Date End Date Taking? Authorizing Provider  Mycophenolate Mofetil (CELLCEPT PO) Take 1,250 mg by mouth 2 (two) times daily.    Yes [provider]  naproxen sodium (ANAPROX) 220 MG tablet Take 220 mg by mouth 2 (two) times daily with a meal.   Yes [provider]  Pyridostigmine Bromide (MESTINON PO) Take 60 mg by mouth 2 (two) times daily.    Yes [provider]     Positive ROS: All other systems have been reviewed and were otherwise negative with the exception of those  mentioned in the HPI and as above.  Physical Exam: General: Alert, no acute distress Cardiovascular: No pedal edema Respiratory: No cyanosis, no use of accessory musculature GI: No organomegaly, abdomen is soft and non-tender Skin: No lesions in the area of chief complaint Neurologic: Sensation intact distally Psychiatric: Patient is competent for consent with normal mood and affect Lymphatic: No axillary or cervical lymphadenopathy  MUSCULOSKELETAL: Right pectoralis muscle has loss of contour, weak with activation, significant bruising down the arm.  Assessment: right pectoralis tendon rupture   Plan: Plan for Procedure(s): RIGHT PECTORALIS TENDON REPAIR  The risks benefits and alternatives were discussed with the patient including but not limited to the risks of nonoperative treatment, versus surgical intervention including infection, bleeding, nerve injury,  blood clots, cardiopulmonary complications, morbidity, mortality, among others, and they were willing to proceed. He has a history of MRSA apparently in the right axilla, and we will plan to administer both vancomycin and Ancef.  Johnny Bridge, MD Cell [phone]   08/12/2016 2:01 PM

## 2016-08-12 NOTE — Discharge Instructions (Signed)
Post Anesthesia Home Care Instructions  Activity: Get plenty of rest for the remainder of the day. A responsible individual must stay with you for 24 hours following the procedure.  For the next 24 hours, DO NOT: -Drive a car -Paediatric nurse -Drink alcoholic beverages -Take any medication unless instructed by your physician -Make any legal decisions or sign important papers.  Meals: Start with liquid foods such as gelatin or soup. Progress to regular foods as tolerated. Avoid greasy, spicy, heavy foods. If nausea and/or vomiting occur, drink only clear liquids until the nausea and/or vomiting subsides. Call your physician if vomiting continues.  Special Instructions/Symptoms: Your throat may feel dry or sore from the anesthesia or the breathing tube placed in your throat during surgery. If this causes discomfort, gargle with warm salt water. The discomfort should disappear within 24 hours.  If you had a scopolamine patch placed behind your ear for the management of post- operative nausea and/or vomiting:  1. The medication in the patch is effective for 72 hours, after which it should be removed.  Wrap patch in a tissue and discard in the trash. Wash hands thoroughly with soap and water. 2. You may remove the patch earlier than 72 hours if you experience unpleasant side effects which may include dry mouth, dizziness or visual disturbances. 3. Avoid touching the patch. Wash your hands with soap and water after contact with the patch.      Diet: As you were doing prior to hospitalization   Shower:  May shower but keep the wounds dry, use an occlusive plastic wrap, NO SOAKING IN TUB.  If the bandage gets wet, change with a clean dry gauze.  If you have a splint on, leave the splint in place and keep the splint dry with a plastic bag.  Dressing:  You may change your dressing 3-5 days after surgery, unless you have a splint.  If you have a splint, then just leave the splint in place and  we will change your bandages during your first follow-up appointment.    If you had hand or foot surgery, we will plan to remove your stitches in about 2 weeks in the office.  For all other surgeries, there are sticky tapes (steri-strips) on your wounds and all the stitches are absorbable.  Leave the steri-strips in place when changing your dressings, they will peel off with time, usually 2-3 weeks.  Activity:  Increase activity slowly as tolerated, but follow the weight bearing instructions below.  The rules on driving is that you can not be taking narcotics while you drive, and you must feel in control of the vehicle.    Weight Bearing:   Sling at all times.    To prevent constipation: you may use a stool softener such as -  Colace (over the counter) 100 mg by mouth twice a day  Drink plenty of fluids (prune juice may be helpful) and high fiber foods Miralax (over the counter) for constipation as needed.    Itching:  If you experience itching with your medications, try taking only a single pain pill, or even half a pain pill at a time.  You may take up to 10 pain pills per day, and you can also use benadryl over the counter for itching or also to help with sleep.   Precautions:  If you experience chest pain or shortness of breath - call 911 immediately for transfer to the hospital emergency department!!  If you develop a fever greater that  101 F, purulent drainage from wound, increased redness or drainage from wound, or calf pain -- Call the office at [phone]                                                Follow- Up Appointment:  Please call for an appointment to be seen in 2 weeks [place] - [phone]

## 2016-08-12 NOTE — Anesthesia Procedure Notes (Signed)
Procedure Name: LMA Insertion Date/Time: 08/12/2016 2:20 PM Performed by: Toula Moos L Pre-anesthesia Checklist: Patient identified, Emergency Drugs available, Suction available, Patient being monitored and Timeout performed Patient Re-evaluated:Patient Re-evaluated prior to inductionOxygen Delivery Method: Circle system utilized Preoxygenation: Pre-oxygenation with 100% oxygen Intubation Type: IV induction Ventilation: Mask ventilation without difficulty and Oral airway inserted - appropriate to patient size LMA: LMA inserted LMA Size: 5.0 Number of attempts: 1 Airway Equipment and Method: Bite block Placement Confirmation: positive ETCO2 Tube secured with: Tape Dental Injury: Teeth and Oropharynx as per pre-operative assessment

## 2016-08-13 ENCOUNTER — Encounter (HOSPITAL_BASED_OUTPATIENT_CLINIC_OR_DEPARTMENT_OTHER): Payer: Self-pay | Admitting: Orthopedic Surgery

## 2016-08-27 DIAGNOSIS — M66821 Spontaneous rupture of other tendons, right upper arm: Secondary | ICD-10-CM | POA: Diagnosis not present

## 2016-09-18 DIAGNOSIS — H524 Presbyopia: Secondary | ICD-10-CM | POA: Diagnosis not present

## 2016-09-27 DIAGNOSIS — M66821 Spontaneous rupture of other tendons, right upper arm: Secondary | ICD-10-CM | POA: Diagnosis not present

## 2016-09-28 ENCOUNTER — Encounter: Payer: Self-pay | Admitting: Physical Therapy

## 2016-09-28 ENCOUNTER — Ambulatory Visit: Payer: 59 | Attending: Orthopedic Surgery | Admitting: Physical Therapy

## 2016-09-28 DIAGNOSIS — G8929 Other chronic pain: Secondary | ICD-10-CM | POA: Diagnosis not present

## 2016-09-28 DIAGNOSIS — R293 Abnormal posture: Secondary | ICD-10-CM | POA: Insufficient documentation

## 2016-09-28 DIAGNOSIS — M25511 Pain in right shoulder: Secondary | ICD-10-CM | POA: Diagnosis not present

## 2016-09-28 DIAGNOSIS — M6281 Muscle weakness (generalized): Secondary | ICD-10-CM | POA: Insufficient documentation

## 2016-09-28 DIAGNOSIS — M25611 Stiffness of right shoulder, not elsewhere classified: Secondary | ICD-10-CM | POA: Insufficient documentation

## 2016-09-28 NOTE — Therapy (Signed)
[place], [place], [address] Phone: [phone]   Fax:  [phone]  Physical Therapy Evaluation  Patient Details  Name: Grant Hernandez MRN: [id number] Date of Birth: 11-Oct-1975 Referring Provider: Marchia Bond Md  Encounter Date: 09/28/2016      PT End of Session - 09/28/16 1645    Visit Number 1   Number of Visits 17   Date for PT Re-Evaluation 11/23/16   PT Start Time 1548   PT Stop Time 1631   PT Time Calculation (min) 43 min   Activity Tolerance Patient tolerated treatment well   Behavior During Therapy [facility] for tasks assessed/performed      Past Medical History:  Diagnosis Date  . 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 MRSA (methicillin resistant Staphylococcus aureus) 2010   right axilla  . Myasthenia gravis ([place])   . Pectoralis muscle rupture     Past Surgical History:  Procedure Laterality Date  . PECTORALIS TENDON REPAIR Right 08/12/2016   Procedure: RIGHT PECTORALIS TENDON REPAIR;  Surgeon: Marchia Bond, MD;  Location: [place];  Service: Orthopedics;  Laterality: Right;  . THYMECTOMY  1998    There were no vitals filed for this visit.       Subjective Assessment - 09/28/16 1557    Subjective pt is a 41 y.o M s/p R pectoralis tendon repair on 08/13/2015 that occured following working out doing bench press on 08/05/2016. Since the surgery pt reports he has been compliant wearing his sling and with avoiding lifting in the R shoulder. occasional intermitent pain with occasional movement. He denies N/T.    Limitations Lifting   How long can you sit comfortably? unlimited   How long can you stand comfortably? unlimite   How long can you walk comfortably? unlimited   Diagnostic tests 08/05/2016 MRI   Patient Stated Goals to get the ROM back, return to work,    Currently in Pain? Yes   Pain  Score 0-No pain  at worst 5/10    Pain Location Shoulder   Pain Orientation Right   Pain Type Surgical pain   Pain Onset More than a month ago   Pain Frequency Intermittent   Aggravating Factors  moving the arm around in a specific way, reaching across the body. reaching up   Pain Relieving Factors relax,             [facility] PT Assessment - 09/28/16 1603      Assessment   Medical Diagnosis s/p R pectoralis tendon repair   Referring Provider Marchia Bond Md   Onset Date/Surgical Date 08/12/16   Hand Dominance Right   Next MD Visit 10/25/2016   Prior Therapy yes     Precautions   Precaution Comments no running, jumping,      Restrictions   Weight Bearing Restrictions Yes   RUE Weight Bearing Non weight bearing     Balance Screen   Has the patient fallen in the past 6 months No   Has the patient had a decrease in activity level because of a fear of falling?  No   Is the patient reluctant to leave their home because of a fear of falling?  No     Home Environment   Living Environment Private residence   Living Arrangements Spouse/significant other   Available Help at Discharge Family;Available PRN/intermittently   Type of Home  House   Home Access Stairs to enter   Entrance Stairs-Number of Steps 14   Entrance Stairs-Rails Right   Home Layout One level   [place] Other (comment)  sling     Prior Function   Level of Independence Independent with basic ADLs   Vocation Full time employment  Echocardiogram   Vocation Requirements prolong standing, lifting, carrying moving   Leisure working out, sporting events, cooking,      Cognition   Overall Cognitive Status Within Functional Limits for tasks assessed     Observation/Other Assessments   Focus on Therapeutic Outcomes (FOTO)  45% limited  predicted 25% limited     Posture/Postural Control   Posture/Postural Control Postural limitations   Postural Limitations Rounded Shoulders;Forward head;Flexed trunk      ROM / Strength   AROM / PROM / Strength AROM;PROM;Strength     AROM   AROM Assessment Site Shoulder   Right/Left Shoulder Left;Right   Right Shoulder Extension 69 Degrees   Right Shoulder Flexion 115 Degrees   Right Shoulder ABduction 82 Degrees  ERP   Right Shoulder Internal Rotation 56 Degrees  ERP  90/90   Right Shoulder External Rotation 42 Degrees  ERP 90/90   Left Shoulder Extension 72 Degrees   Left Shoulder Flexion 146 Degrees   Left Shoulder ABduction 123 Degrees   Left Shoulder Internal Rotation 64 Degrees  90/90   Left Shoulder External Rotation 70 Degrees  90/90     PROM   PROM Assessment Site Shoulder   Right/Left Shoulder Right   Right Shoulder Flexion 136 Degrees   Right Shoulder ABduction 105 Degrees   Right Shoulder Internal Rotation 56 Degrees   Right Shoulder External Rotation 42 Degrees     Strength   Strength Assessment Site Shoulder;Hand   Right/Left Shoulder Right;Left   Right Shoulder Flexion 4+/5   Right Shoulder Extension 5/5   Right Shoulder ABduction 4+/5   Right Shoulder Internal Rotation 3/5  pain during testing   Right Shoulder External Rotation 4+/5   Left Shoulder Flexion 4+/5   Left Shoulder Extension 5/5   Left Shoulder ABduction 4+/5   Left Shoulder Internal Rotation 5/5   Left Shoulder External Rotation 5/5   Right Hand Grip (lbs) 115  125,115,105   Left Hand Grip (lbs) 96.7  105,87,98     Palpation   Palpation comment TTP along the medial lip of the bicipital groove            Objective measurements completed on examination: See above findings.                  PT Education - 09/28/16 1644    Education provided Yes   Education Details evaluation findings, POC, goals, HEP with form/ rationale, anatomy of area involved.    Person(s) Educated Patient   Methods Explanation;Verbal cues;Handout;Demonstration   Comprehension Verbalized understanding;Verbal cues required          PT Short Term Goals -  09/28/16 1733      PT SHORT TERM GOAL #1   Title pt to be I with inital HEp    Time 4   Period Weeks   Status New   Target Date 10/26/16     PT SHORT TERM GOAL #2   Title pt to increase R shoulder flexion/ abduction to >/= 120 degrees to </= 4/10 pain to promote functional mobility    Time 4   Period Weeks   Status New   Target Date  10/26/16     PT SHORT TERM GOAL #3   Title pt to demo proper posture and lifting/ carrying mechanics to prevent and reduce pain and reinjury    Time 4   Period Weeks   Status New   Target Date 10/26/16           PT Long Term Goals - 09/28/16 1734      PT LONG TERM GOAL #1   Title pt will increase R shoulder flexion/ abduction to >/= 140 degrees and IR/ ER to >/= 60 degrees with </=1/10  for functional mobility required for ADLS and work related activities    Time 8   Period Weeks   Status New   Target Date 11/23/16     PT LONG TERM GOAL #2   Title pt to increase over R shoulder strength to >/= 4+/5 to provide shoulder stability with liftin and carrying activities    Time 8   Period Weeks   Status New   Target Date 11/23/16     PT LONG TERM GOAL #3   Title pt to be able to lift and lower >/= 15# from overhead shelf and push/pull >/= 20# with </=1/10 pain for functional lifting and carrying activities and work related tasks    Time 8   Period Weeks   Status New   Target Date 11/23/16     PT LONG TERM GOAL #4   Title increaes FOTO score to </= 25% limited to demo improvement in function    Time 8   Period Weeks   Status New   Target Date 11/23/16     PT LONG TERM GOAL #5   Title pt to be I with all HEP given as of last visit    Time 8   Period Weeks   Status New   Target Date 11/23/16                Plan - 09/28/16 1645    Clinical Impression Statement pt presents to OPPT s/p R pectoral tendon repair on 08/12/2016. He exhibits limited flexion/ abdction and IR/ ER compared bil and functional strength inall planes  except for IR with pain. TTP along the medial lip of the bicipital groove, and tightness in bil upper trap/ levator scapulae. He would benefit from physical therapy to decrease pain, improve shoulder mobility/ scapular stability, increase strength and return to PLOF by addressing the deficits listed.    Clinical Presentation Stable   Clinical Decision Making Low   Rehab Potential Good   PT Frequency 2x / week   PT Duration 8 weeks   PT Treatment/Interventions Dry needling;Manual techniques;Passive range of motion;ADLs/Self Care Home Management;Cryotherapy;Electrical Stimulation;Iontophoresis 4mg /ml Dexamethasone;Moist Heat;Ultrasound;Therapeutic activities;Therapeutic exercise;Neuromuscular re-education;Patient/family education;Gait training;Taping   PT Next Visit Plan assess/ review HEP and update PRN, scapular stability, shoulder AAROM, manual/ IASTM   PT Home Exercise Plan table slides flexion/ abduction, scapular retraction. upper trap stretching, rhomboid stretch   Consulted and Agree with Plan of Care Patient      Patient will benefit from skilled therapeutic intervention in order to improve the following deficits and impairments:  Pain, Improper body mechanics, Postural dysfunction, Decreased strength, Increased fascial restricitons, Decreased endurance, Decreased activity tolerance, Decreased range of motion  Visit Diagnosis: Chronic right shoulder pain - Plan: PT plan of care cert/re-cert  Stiffness of right shoulder, not elsewhere classified - Plan: PT plan of care cert/re-cert  Muscle weakness (generalized) - Plan: PT plan of care cert/re-cert  Abnormal posture -  Plan: PT plan of care cert/re-cert     Problem List There are no active problems to display for this patient.  Starr Lake PT, DPT, LAT, ATC  09/28/16  5:40 PM      [facility] [address] [place], [place], [address] Phone: [phone]   Fax:   [phone]  Name: CHARLOTTE BRAFFORD MRN: [id number] Date of Birth: 06-Dec-1975

## 2016-10-12 ENCOUNTER — Ambulatory Visit: Payer: 59 | Attending: Orthopedic Surgery | Admitting: Physical Therapy

## 2016-10-12 ENCOUNTER — Encounter: Payer: Self-pay | Admitting: Physical Therapy

## 2016-10-12 DIAGNOSIS — M25611 Stiffness of right shoulder, not elsewhere classified: Secondary | ICD-10-CM | POA: Insufficient documentation

## 2016-10-12 DIAGNOSIS — M6281 Muscle weakness (generalized): Secondary | ICD-10-CM | POA: Insufficient documentation

## 2016-10-12 DIAGNOSIS — G8929 Other chronic pain: Secondary | ICD-10-CM | POA: Insufficient documentation

## 2016-10-12 DIAGNOSIS — R293 Abnormal posture: Secondary | ICD-10-CM | POA: Diagnosis not present

## 2016-10-12 DIAGNOSIS — M25511 Pain in right shoulder: Secondary | ICD-10-CM | POA: Insufficient documentation

## 2016-10-12 NOTE — Therapy (Signed)
[place], [place], [address] Phone: [phone]   Fax:  [phone]  Physical Therapy Treatment  Patient Details  Name: Grant Hernandez MRN: [id number] Date of Birth: Dec 28, 1975 Referring Provider: Marchia Bond Md  Encounter Date: 10/12/2016      PT End of Session - 10/12/16 1012    Visit Number 2   Number of Visits 17   Date for PT Re-Evaluation 11/23/16   PT Start Time 0932   PT Stop Time 1012   PT Time Calculation (min) 40 min   Activity Tolerance Patient tolerated treatment well   Behavior During Therapy [facility] for tasks assessed/performed      Past Medical History:  Diagnosis Date  . 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 MRSA (methicillin resistant Staphylococcus aureus) 2010   right axilla  . Myasthenia gravis ([place])   . Pectoralis muscle rupture     Past Surgical History:  Procedure Laterality Date  . PECTORALIS TENDON REPAIR Right 08/12/2016   Procedure: RIGHT PECTORALIS TENDON REPAIR;  Surgeon: Marchia Bond, MD;  Location: [place];  Service: Orthopedics;  Laterality: Right;  . THYMECTOMY  1998    There were no vitals filed for this visit.      Subjective Assessment - 10/12/16 0933    Subjective "Everything is going okay, the pain isn't above a 2/10" pt reports being consistent with the HEP    Currently in Pain? Yes   Pain Score 2    Pain Location Shoulder   Pain Orientation Right   Pain Descriptors / Indicators Aching   Pain Type Surgical pain   Pain Onset More than a month ago   Pain Frequency Intermittent   Aggravating Factors  quick movements,    Pain Relieving Factors relax            [facility] PT Assessment - 10/12/16 1006      AROM   Right Shoulder Flexion 126 Degrees  AAROM with wall ladder   Right Shoulder ABduction 100 Degrees  AAROM with wall ladder                      [facility] Adult PT Treatment/Exercise - 10/12/16 0937      Shoulder Exercises: Standing   Row Strengthening;Both;10 reps;Theraband   Theraband Level (Shoulder Row) Level 3 (Green)     Shoulder Exercises: ROM/Strengthening   UBE (Upper Arm Bike) L1 x 4 min  changing direction at 2 min   Other ROM/Strengthening Exercises wall ladder flexion/ abduction 5 x ea with controlled eccentric lowering     Shoulder Exercises: Isometric Strengthening   Flexion --  10 x 5 sec hold   Extension --  10 x 5 sec hold   External Rotation --  10 x 5 sec hold   Internal Rotation --  10 x 5 sec hold     Shoulder Exercises: Stretch   Other Shoulder Stretches Rhomboid stretch 2 x 30 sec, upper trap stretch 2 x 30 sec   cues for proper form   Other Shoulder Stretches gentle pec stretch 2 x 30 sec  cues to stay within painfree range                  PT Short Term Goals - 09/28/16 1733      PT SHORT TERM GOAL #1   Title pt to be I  with inital HEp    Time 4   Period Weeks   Status New   Target Date 10/26/16     PT SHORT TERM GOAL #2   Title pt to increase R shoulder flexion/ abduction to >/= 120 degrees to </= 4/10 pain to promote functional mobility    Time 4   Period Weeks   Status New   Target Date 10/26/16     PT SHORT TERM GOAL #3   Title pt to demo proper posture and lifting/ carrying mechanics to prevent and reduce pain and reinjury    Time 4   Period Weeks   Status New   Target Date 10/26/16           PT Long Term Goals - 09/28/16 1734      PT LONG TERM GOAL #1   Title pt will increase R shoulder flexion/ abduction to >/= 140 degrees and IR/ ER to >/= 60 degrees with </=1/10  for functional mobility required for ADLS and work related activities    Time 8   Period Weeks   Status New   Target Date 11/23/16     PT LONG TERM GOAL #2   Title pt to increase over R shoulder strength to >/= 4+/5 to provide shoulder stability with liftin and  carrying activities    Time 8   Period Weeks   Status New   Target Date 11/23/16     PT LONG TERM GOAL #3   Title pt to be able to lift and lower >/= 15# from overhead shelf and push/pull >/= 20# with </=1/10 pain for functional lifting and carrying activities and work related tasks    Time 8   Period Weeks   Status New   Target Date 11/23/16     PT LONG TERM GOAL #4   Title increaes FOTO score to </= 25% limited to demo improvement in function    Time 8   Period Weeks   Status New   Target Date 11/23/16     PT LONG TERM GOAL #5   Title pt to be I with all HEP given as of last visit    Time 8   Period Weeks   Status New   Target Date 11/23/16               Plan - 10/12/16 1012    Clinical Impression Statement pt reported 2/10 pain today. reviewed previously provided HEP. 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 progress isometric strengthening with cues to avoid quick movements. pt declined modalities post session.    PT Next Visit Plan update HEP PRN, scapular stability, shoulder AAROM, wall ladder,  manual/ IASTM PRN, scapular mobility/ GHJ mobs PRN.    PT Home Exercise Plan table slides flexion/ abduction, scapular retraction. upper trap stretching, rhomboid stretch, shoulder isometrics   Consulted and Agree with Plan of Care Patient      Patient will benefit from skilled therapeutic intervention in order to improve the following deficits and impairments:     Visit Diagnosis: Chronic right shoulder pain  Stiffness of right shoulder, not elsewhere classified  Muscle weakness (generalized)  Abnormal posture     Problem List There are no active problems to display for this patient.  Starr Lake PT, DPT, LAT, ATC  10/12/16  10:16 AM      [facility] [address] [place], [place], [address] Phone: [phone]  Fax:  [phone]  Name:  Grant Hernandez MRN: [id number] Date of Birth: Nov 19, 1975

## 2016-10-18 ENCOUNTER — Encounter: Payer: 59 | Admitting: Physical Therapy

## 2016-10-21 ENCOUNTER — Encounter: Payer: Self-pay | Admitting: Physical Therapy

## 2016-10-21 ENCOUNTER — Ambulatory Visit: Payer: 59 | Admitting: Physical Therapy

## 2016-10-21 DIAGNOSIS — M25611 Stiffness of right shoulder, not elsewhere classified: Secondary | ICD-10-CM

## 2016-10-21 DIAGNOSIS — M25511 Pain in right shoulder: Principal | ICD-10-CM

## 2016-10-21 DIAGNOSIS — M6281 Muscle weakness (generalized): Secondary | ICD-10-CM | POA: Diagnosis not present

## 2016-10-21 DIAGNOSIS — G8929 Other chronic pain: Secondary | ICD-10-CM | POA: Diagnosis not present

## 2016-10-21 DIAGNOSIS — R293 Abnormal posture: Secondary | ICD-10-CM | POA: Diagnosis not present

## 2016-10-21 NOTE — Therapy (Signed)
[place], [place], [address] Phone: [phone]   Fax:  [phone]  Physical Therapy Treatment  Patient Details  Name: Grant Hernandez MRN: [id number] Date of Birth: [birth year] Referring Provider: Marchia Bond Md  Encounter Date: 10/21/2016      PT End of Session - 10/21/16 1734    Visit Number 3   Number of Visits 17   Date for PT Re-Evaluation 11/23/16   PT Start Time 1633   PT Stop Time 2229   PT Time Calculation (min) 42 min   Activity Tolerance Patient tolerated treatment well   Behavior During Therapy [facility] for tasks assessed/performed      Past Medical History:  Diagnosis Date  . 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 MRSA (methicillin resistant Staphylococcus aureus) 2010   right axilla  . Myasthenia gravis ([place])   . Pectoralis muscle rupture     Past Surgical History:  Procedure Laterality Date  . PECTORALIS TENDON REPAIR Right 08/12/2016   Procedure: RIGHT PECTORALIS TENDON REPAIR;  Surgeon: Marchia Bond, MD;  Location: [place];  Service: Orthopedics;  Laterality: Right;  . THYMECTOMY  1998    There were no vitals filed for this visit.      Subjective Assessment - 10/21/16 1641    Subjective No pain except when I stretch my arm overhead resting on a pillow   Currently in Pain? No/denies   Pain Location Shoulder   Pain Orientation Right   Pain Frequency Intermittent   Aggravating Factors  RESTING ARm on a pillow above head   Pain Relieving Factors change of position   Multiple Pain Sites No                         [facility] Adult PT Treatment/Exercise - 10/21/16 0001      Self-Care   Self-Care Other Self-Care Comments   Other Self-Care Comments  Patient asked what the trainer could do for his shoulder at the gym.  Answer: nothing now for shouldewr.  May work core  and legs.  Patient asked if he could get a deep tissue massage.  Marland Kitchen  Answer: may get deep tissue massage for everything except shoulder area. Precautions reviewed.     Shoulder Exercises: Seated   External Rotation 10 reps;AAROM   External Rotation Limitations cued to keep elbow close to body   Other Seated Exercises seated scapular retraction 5 X 10 seconds.  correct technique used.     Shoulder Exercises: Standing   Flexion AAROM   Flexion Limitations UE ranger to 135   ABduction AAROM   ABduction Limitations UE ranger to 120,  20 X   Row Strengthening   Theraband Level (Shoulder Row) Level 3 (Green)   Row Limitations good form, 30 x     Shoulder Exercises: ROM/Strengthening   Other ROM/Strengthening Exercises wall ladder flexion/ abduction 10 x ea with controlled eccentric lowering     Shoulder Exercises: Isometric Strengthening   Flexion --  10 x 5 sec hold   Extension --  10 x 5 sec hold     Shoulder Exercises: Stretch   Other Shoulder Stretches Rhomboid 1 X60 seconde with instrument assist.  and 2 X each upper trap LT tighter than right.    Other Shoulder Stretches Seated trunk rotation starting at hips all the way to the neck 3  x each holding 10 seconds at stretch,  slow gentle movements.      Manual Therapy   Manual therapy comments instrument assist rhomboids. biceps tendon area. soft tissue work.    checked scar.  It was non tight ,  tissue slightly stiff                  PT Short Term Goals - 09/28/16 1733      PT SHORT TERM GOAL #1   Title pt to be I with inital HEp    Time 4   Period Weeks   Status New   Target Date 10/26/16     PT SHORT TERM GOAL #2   Title pt to increase R shoulder flexion/ abduction to >/= 120 degrees to </= 4/10 pain to promote functional mobility    Time 4   Period Weeks   Status New   Target Date 10/26/16     PT SHORT TERM GOAL #3   Title pt to demo proper posture and lifting/ carrying mechanics to prevent and reduce  pain and reinjury    Time 4   Period Weeks   Status New   Target Date 10/26/16           PT Long Term Goals - 09/28/16 1734      PT LONG TERM GOAL #1   Title pt will increase R shoulder flexion/ abduction to >/= 140 degrees and IR/ ER to >/= 60 degrees with </=1/10  for functional mobility required for ADLS and work related activities    Time 8   Period Weeks   Status New   Target Date 11/23/16     PT LONG TERM GOAL #2   Title pt to increase over R shoulder strength to >/= 4+/5 to provide shoulder stability with liftin and carrying activities    Time 8   Period Weeks   Status New   Target Date 11/23/16     PT LONG TERM GOAL #3   Title pt to be able to lift and lower >/= 15# from overhead shelf and push/pull >/= 20# with </=1/10 pain for functional lifting and carrying activities and work related tasks    Time 8   Period Weeks   Status New   Target Date 11/23/16     PT LONG TERM GOAL #4   Title increaes FOTO score to </= 25% limited to demo improvement in function    Time 8   Period Weeks   Status New   Target Date 11/23/16     PT LONG TERM GOAL #5   Title pt to be I with all HEP given as of last visit    Time 8   Period Weeks   Status New   Target Date 11/23/16               Plan - 10/21/16 1735    Clinical Impression Statement No pain today in shoulder.  Patient has flexion to 135 on UE ranger standing  and abduction to 120.  No pain at end of session.   PT Treatment/Interventions Dry needling;Manual techniques;Passive range of motion;ADLs/Self Care Home Management;Cryotherapy;Electrical Stimulation;Iontophoresis 4mg /ml Dexamethasone;Moist Heat;Ultrasound;Therapeutic activities;Therapeutic exercise;Neuromuscular re-education;Patient/family education;Gait training;Taping   PT Next Visit Plan update HEP PRN, scapular stability, shoulder AAROM, wall ladder,  manual/ IASTM PRN, scapular mobility/ GHJ mobs PRN.  MD note for the 14 th.   PT Home Exercise Plan  table slides flexion/ abduction, scapular retraction. upper trap stretching, rhomboid stretch, shoulder  isometrics   Consulted and Agree with Plan of Care Patient      Patient will benefit from skilled therapeutic intervention in order to improve the following deficits and impairments:  Pain, Improper body mechanics, Postural dysfunction, Decreased strength, Increased fascial restricitons, Decreased endurance, Decreased activity tolerance, Decreased range of motion  Visit Diagnosis: Chronic right shoulder pain  Stiffness of right shoulder, not elsewhere classified  Muscle weakness (generalized)  Abnormal posture     Problem List There are no active problems to display for this patient.   Grant Hernandez PTA 10/21/2016, 5:41 PM  [facility] [address] [place], [place], [address] Phone: [phone]   Fax:  [phone]  Name: Grant Hernandez MRN: [id number] Date of Birth: [birth year]

## 2016-10-25 ENCOUNTER — Encounter: Payer: Self-pay | Admitting: Physical Therapy

## 2016-10-25 ENCOUNTER — Ambulatory Visit: Payer: 59 | Admitting: Physical Therapy

## 2016-10-25 DIAGNOSIS — M25511 Pain in right shoulder: Principal | ICD-10-CM

## 2016-10-25 DIAGNOSIS — G8929 Other chronic pain: Secondary | ICD-10-CM | POA: Diagnosis not present

## 2016-10-25 DIAGNOSIS — M25611 Stiffness of right shoulder, not elsewhere classified: Secondary | ICD-10-CM

## 2016-10-25 DIAGNOSIS — M6281 Muscle weakness (generalized): Secondary | ICD-10-CM

## 2016-10-25 DIAGNOSIS — R293 Abnormal posture: Secondary | ICD-10-CM | POA: Diagnosis not present

## 2016-10-25 NOTE — Therapy (Signed)
[place], [place], [address] Phone: [phone]   Fax:  [phone]  Physical Therapy Treatment  Patient Details  Name: Grant Hernandez MRN: [id number] Date of Birth: 04-Jan-1976 Referring Provider: Marchia Bond Md  Encounter Date: 10/25/2016      PT End of Session - 10/25/16 1637    Visit Number 4   Number of Visits 17   Date for PT Re-Evaluation 11/23/16   PT Start Time 3903  pt arrived 6 min late   PT Stop Time 1717   PT Time Calculation (min) 40 min   Activity Tolerance Patient tolerated treatment well   Behavior During Therapy [facility] for tasks assessed/performed      Past Medical History:  Diagnosis Date  . 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 MRSA (methicillin resistant Staphylococcus aureus) 2010   right axilla  . Myasthenia gravis ([place])   . Pectoralis muscle rupture     Past Surgical History:  Procedure Laterality Date  . PECTORALIS TENDON REPAIR Right 08/12/2016   Procedure: RIGHT PECTORALIS TENDON REPAIR;  Surgeon: Marchia Bond, MD;  Location: [place];  Service: Orthopedics;  Laterality: Right;  . THYMECTOMY  1998    There were no vitals filed for this visit.      Subjective Assessment - 10/25/16 1637    Subjective "I was alittle sore after the last session, but not to bad"   Currently in Pain? No/denies                         [facility] Adult PT Treatment/Exercise - 10/25/16 1641      Shoulder Exercises: Supine   Protraction 15 reps;Both;Strengthening;AAROM  with dowel rod, tactile cues for equal height     Shoulder Exercises: Seated   Other Seated Exercises concentraction bicep curl 2 x 15  8#   Other Seated Exercises tricep press kick backs 2 x 12  4#      Shoulder Exercises: Prone   Other Prone Exercises I's, T's, y's and w's 2 x 10 ea.      Shoulder  Exercises: ROM/Strengthening   UBE (Upper Arm Bike) L2 x 6 min  changing direction at 3 min     Shoulder Exercises: Stretch   Corner Stretch 2 reps;30 seconds   Corner Stretch Limitations cues for gentle stretching    Other Shoulder Stretches rhomboid stretch 2 x 30 sec hold   Other Shoulder Stretches 3 way bicep stretch 30 sec hold each                  PT Short Term Goals - 10/25/16 1725      PT SHORT TERM GOAL #1   Title pt to be I with inital HEp    Time 4   Period Weeks   Status Achieved     PT SHORT TERM GOAL #2   Title pt to increase R shoulder flexion/ abduction to >/= 120 degrees to </= 4/10 pain to promote functional mobility    Time 4   Period Weeks   Status Partially Met     PT SHORT TERM GOAL #3   Title pt to demo proper posture and lifting/ carrying mechanics to prevent and reduce pain and reinjury    Time 4   Period Weeks   Status Unable to assess  PT Long Term Goals - 09/28/16 1734      PT LONG TERM GOAL #1   Title pt will increase R shoulder flexion/ abduction to >/= 140 degrees and IR/ ER to >/= 60 degrees with </=1/10  for functional mobility required for ADLS and work related activities    Time 8   Period Weeks   Status New   Target Date 11/23/16     PT LONG TERM GOAL #2   Title pt to increase over R shoulder strength to >/= 4+/5 to provide shoulder stability with liftin and carrying activities    Time 8   Period Weeks   Status New   Target Date 11/23/16     PT LONG TERM GOAL #3   Title pt to be able to lift and lower >/= 15# from overhead shelf and push/pull >/= 20# with </=1/10 pain for functional lifting and carrying activities and work related tasks    Time 8   Period Weeks   Status New   Target Date 11/23/16     PT LONG TERM GOAL #4   Title increaes FOTO score to </= 25% limited to demo improvement in function    Time 8   Period Weeks   Status New   Target Date 11/23/16     PT LONG TERM GOAL #5   Title pt  to be I with all HEP given as of last visit    Time 8   Period Weeks   Status New   Target Date 11/23/16               Plan - 10/25/16 1722    Clinical Impression Statement pt continues to report no pain today. Focused on scapular stability in prone which he reported no pain just reported he was surprised how weak he was. Answered questions regarding weight lifting. pt reported no pain at end of session and declined modalities.    PT Next Visit Plan update HEP PRN, scapular stability, shoulder AAROM, wall ladder,  manual/ IASTM PRN, scapular mobility/ GHJ mobs PRN.  MD note for the 96 th.   PT Home Exercise Plan table slides flexion/ abduction, scapular retraction. upper trap stretching, rhomboid stretch, shoulder isometrics, I's T's Y's   Consulted and Agree with Plan of Care Patient      Patient will benefit from skilled therapeutic intervention in order to improve the following deficits and impairments:  Pain, Improper body mechanics, Postural dysfunction, Decreased strength, Increased fascial restricitons, Decreased endurance, Decreased activity tolerance, Decreased range of motion  Visit Diagnosis: Chronic right shoulder pain  Stiffness of right shoulder, not elsewhere classified  Muscle weakness (generalized)  Abnormal posture     Problem List There are no active problems to display for this patient.   Starr Lake 10/25/2016, 5:28 PM  [facility] [address] [place], [place], [address] Phone: [phone]   Fax:  [phone]  Name: Grant Hernandez MRN: [id number] Date of Birth: 06-24-1975

## 2016-10-28 ENCOUNTER — Encounter: Payer: Self-pay | Admitting: Physical Therapy

## 2016-10-28 ENCOUNTER — Ambulatory Visit: Payer: 59 | Admitting: Physical Therapy

## 2016-10-28 DIAGNOSIS — R293 Abnormal posture: Secondary | ICD-10-CM | POA: Diagnosis not present

## 2016-10-28 DIAGNOSIS — G8929 Other chronic pain: Secondary | ICD-10-CM

## 2016-10-28 DIAGNOSIS — M25511 Pain in right shoulder: Secondary | ICD-10-CM | POA: Diagnosis not present

## 2016-10-28 DIAGNOSIS — M6281 Muscle weakness (generalized): Secondary | ICD-10-CM | POA: Diagnosis not present

## 2016-10-28 DIAGNOSIS — M25611 Stiffness of right shoulder, not elsewhere classified: Secondary | ICD-10-CM | POA: Diagnosis not present

## 2016-10-28 NOTE — Patient Instructions (Signed)
Refer to what PT said last visit re what he could do in the gym.  No ropes for now.

## 2016-10-28 NOTE — Therapy (Signed)
[place], [place], [address] Phone: [phone]   Fax:  [phone]  Physical Therapy Treatment  Patient Details  Name: Grant Hernandez MRN: [id number] Date of Birth: [birth year] Referring Provider: Marchia Bond Md  Encounter Date: 10/28/2016      PT End of Session - 10/28/16 1720    Visit Number 5   Number of Visits 17   Date for PT Re-Evaluation 11/23/16   PT Start Time 6962   PT Stop Time 1725   PT Time Calculation (min) 54 min   Activity Tolerance Patient tolerated treatment well   Behavior During Therapy [facility] for tasks assessed/performed      Past Medical History:  Diagnosis Date  . 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 MRSA (methicillin resistant Staphylococcus aureus) 2010   right axilla  . Myasthenia gravis ([place])   . Pectoralis muscle rupture     Past Surgical History:  Procedure Laterality Date  . PECTORALIS TENDON REPAIR Right 08/12/2016   Procedure: RIGHT PECTORALIS TENDON REPAIR;  Surgeon: Marchia Bond, MD;  Location: [place];  Service: Orthopedics;  Laterality: Right;  . THYMECTOMY  1998    There were no vitals filed for this visit.      Subjective Assessment - 10/28/16 1635    Subjective Can I do the ropes at the gym?   Less pain with resting arm on a pillow overhead.   Currently in Pain? Yes   Pain Score --  mild soreness   Pain Location Shoulder   Pain Orientation Right   Pain Descriptors / Indicators Aching   Pain Type Surgical pain   Pain Relieving Factors chande of position   Multiple Pain Sites No                         [facility] Adult PT Treatment/Exercise - 10/28/16 0001      Shoulder Exercises: Supine   Protraction 20 reps   Protraction Limitations noted protraction movement up equal to left , and with lowering it was slightly behind,  AA with  this.      Shoulder Exercises: Seated   Other Seated Exercises concentraction bicep curl 2 x 15  8#   Other Seated Exercises tricep press kick backs 2 x 12     Shoulder Exercises: Prone   Other Prone Exercises T"S 10 X 2 sets,   I, Y. W. extension. 10 x 2 sets.     Shoulder Exercises: ROM/Strengthening   UBE (Upper Arm Bike) L2 x 6 min  changing direction at 3 min     Shoulder Exercises: Stretch   Corner Stretch 3 reps;30 seconds   Corner Stretch Limitations cued to lower hands for  decreased pain   Other Shoulder Stretches rhomboid stretch 2 x 30 sec hold   Other Shoulder Stretches 3 way bicep stretch 30 sec hold each     Modalities   Modalities Cryotherapy     Cryotherapy   Number Minutes Cryotherapy 10 Minutes   Cryotherapy Location Shoulder   Type of Cryotherapy --  cold pack                  PT Short Term Goals - 10/28/16 1723      PT SHORT TERM GOAL #1   Title pt to be I with inital HEp    Time 4  Period Weeks   Status Achieved     PT SHORT TERM GOAL #2   Title pt to increase R shoulder flexion/ abduction to >/= 120 degrees to </= 4/10 pain to promote functional mobility    Baseline 122 flexion,  abduction 88,  Pain 6/10   Time 4   Period Weeks   Status Partially Met     PT SHORT TERM GOAL #3   Title pt to demo proper posture and lifting/ carrying mechanics to prevent and reduce pain and reinjury    Time 4   Period Weeks   Status Unable to assess           PT Long Term Goals - 09/28/16 1734      PT LONG TERM GOAL #1   Title pt will increase R shoulder flexion/ abduction to >/= 140 degrees and IR/ ER to >/= 60 degrees with </=1/10  for functional mobility required for ADLS and work related activities    Time 8   Period Weeks   Status New   Target Date 11/23/16     PT LONG TERM GOAL #2   Title pt to increase over R shoulder strength to >/= 4+/5 to provide shoulder stability with liftin and carrying activities    Time 8   Period  Weeks   Status New   Target Date 11/23/16     PT LONG TERM GOAL #3   Title pt to be able to lift and lower >/= 15# from overhead shelf and push/pull >/= 20# with </=1/10 pain for functional lifting and carrying activities and work related tasks    Time 8   Period Weeks   Status New   Target Date 11/23/16     PT LONG TERM GOAL #4   Title increaes FOTO score to </= 25% limited to demo improvement in function    Time 8   Period Weeks   Status New   Target Date 11/23/16     PT LONG TERM GOAL #5   Title pt to be I with all HEP given as of last visit    Time 8   Period Weeks   Status New   Target Date 11/23/16               Plan - 10/28/16 1721    Clinical Impression Statement No pain at end of session, just a soreness awareness deep in shoulder from using muscles.    PT Treatment/Interventions Dry needling;Manual techniques;Passive range of motion;ADLs/Self Care Home Management;Cryotherapy;Electrical Stimulation;Iontophoresis 43m/ml Dexamethasone;Moist Heat;Ultrasound;Therapeutic activities;Therapeutic exercise;Neuromuscular re-education;Patient/family education;Gait training;Taping   PT Next Visit Plan update HEP PRN, scapular stability, shoulder AAROM, wall ladder,  manual/ IASTM PRN, scapular mobility/ GHJ mobs PRN. MD visit : HOW did it go?   PT Home Exercise Plan table slides flexion/ abduction, scapular retraction. upper trap stretching, rhomboid stretch, shoulder isometrics, I's T's Y's   Consulted and Agree with Plan of Care Patient      Patient will benefit from skilled therapeutic intervention in order to improve the following deficits and impairments:  Pain, Improper body mechanics, Postural dysfunction, Decreased strength, Increased fascial restricitons, Decreased endurance, Decreased activity tolerance, Decreased range of motion  Visit Diagnosis: Chronic right shoulder pain  Stiffness of right shoulder, not elsewhere classified  Muscle weakness  (generalized)  Abnormal posture     Problem List There are no active problems to display for this patient.   Idabell Picking  PTA 10/28/2016, 5:31 PM  [facility]  [place], [place], [address] Phone: [phone]   Fax:  [phone]  Name: Grant Hernandez MRN: [id number] Date of Birth: 19-Oct-1975

## 2016-11-01 ENCOUNTER — Encounter: Payer: Self-pay | Admitting: Physical Therapy

## 2016-11-01 ENCOUNTER — Ambulatory Visit: Payer: 59 | Admitting: Physical Therapy

## 2016-11-01 DIAGNOSIS — M25611 Stiffness of right shoulder, not elsewhere classified: Secondary | ICD-10-CM

## 2016-11-01 DIAGNOSIS — R293 Abnormal posture: Secondary | ICD-10-CM

## 2016-11-01 DIAGNOSIS — M6281 Muscle weakness (generalized): Secondary | ICD-10-CM

## 2016-11-01 DIAGNOSIS — G8929 Other chronic pain: Secondary | ICD-10-CM

## 2016-11-01 DIAGNOSIS — M25511 Pain in right shoulder: Secondary | ICD-10-CM | POA: Diagnosis not present

## 2016-11-01 DIAGNOSIS — M66821 Spontaneous rupture of other tendons, right upper arm: Secondary | ICD-10-CM | POA: Diagnosis not present

## 2016-11-01 NOTE — Therapy (Signed)
[place], [place], [address] Phone: [phone]   Fax:  [phone]  Physical Therapy Treatment  Patient Details  Name: Grant Hernandez MRN: [id number] Date of Birth: 04/08/1975 Referring Provider: Marchia Bond Md  Encounter Date: 11/01/2016      PT End of Session - 11/01/16 1633    Visit Number 6   Number of Visits 17   Date for PT Re-Evaluation 11/23/16   PT Start Time 1633   PT Stop Time 1723   PT Time Calculation (min) 50 min   Activity Tolerance Patient tolerated treatment well   Behavior During Therapy [facility] for tasks assessed/performed      Past Medical History:  Diagnosis Date  . 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 MRSA (methicillin resistant Staphylococcus aureus) 2010   right axilla  . Myasthenia gravis ([place])   . Pectoralis muscle rupture     Past Surgical History:  Procedure Laterality Date  . PECTORALIS TENDON REPAIR Right 08/12/2016   Procedure: RIGHT PECTORALIS TENDON REPAIR;  Surgeon: Marchia Bond, MD;  Location: [place];  Service: Orthopedics;  Laterality: Right;  . THYMECTOMY  1998    There were no vitals filed for this visit.      Subjective Assessment - 11/01/16 1633    Subjective "No issues or problems, doing exercise every day that I don't go to the gym"   Currently in Pain? No/denies   Aggravating Factors  pushing the buttons together on a pt's gown when I push them together   Pain Relieving Factors N/A                         [facility] Adult PT Treatment/Exercise - 11/01/16 1635      Shoulder Exercises: Supine   Protraction Strengthening;Both;Other (comment)  with hands toghether pushing with 1#     Shoulder Exercises: Standing   External Rotation Strengthening   Flexion AROM;Strengthening;Both;15 reps;Weights   Shoulder Flexion Weight (lbs) 1    Flexion Limitations scaption position   Extension 15 reps;Theraband   Theraband Level (Shoulder Extension) Level 4 (Blue)   Row 15 reps;Theraband  x 2 sets   Theraband Level (Shoulder Row) Level 4 (Blue)     Shoulder Exercises: ROM/Strengthening   UBE (Upper Arm Bike) L3 x 6 min  changing direction at 3 min     Shoulder Exercises: Stretch   Corner Stretch 2 reps;30 seconds     Cryotherapy   Number Minutes Cryotherapy 10 Minutes   Cryotherapy Location Shoulder   Type of Cryotherapy Ice pack     Manual Therapy   Manual Therapy Soft tissue mobilization   Manual therapy comments manual trigger point release over the medial aspect of pec major x 3   Soft tissue mobilization IASTM over the pec major muscle                   PT Short Term Goals - 10/28/16 1723      PT SHORT TERM GOAL #1   Title pt to be I with inital HEp    Time 4   Period Weeks   Status Achieved     PT SHORT TERM GOAL #2   Title pt to increase R shoulder flexion/ abduction to >/= 120 degrees to </= 4/10 pain to promote functional mobility    Baseline 122 flexion,  abduction 88,  Pain 6/10   Time 4   Period Weeks   Status Partially Met     PT SHORT TERM GOAL #3   Title pt to demo proper posture and lifting/ carrying mechanics to prevent and reduce pain and reinjury    Time 4   Period Weeks   Status Unable to assess           PT Long Term Goals - 09/28/16 1734      PT LONG TERM GOAL #1   Title pt will increase R shoulder flexion/ abduction to >/= 140 degrees and IR/ ER to >/= 60 degrees with </=1/10  for functional mobility required for ADLS and work related activities    Time 8   Period Weeks   Status New   Target Date 11/23/16     PT LONG TERM GOAL #2   Title pt to increase over R shoulder strength to >/= 4+/5 to provide shoulder stability with liftin and carrying activities    Time 8   Period Weeks   Status New   Target Date 11/23/16     PT LONG TERM GOAL #3   Title pt to be  able to lift and lower >/= 15# from overhead shelf and push/pull >/= 20# with </=1/10 pain for functional lifting and carrying activities and work related tasks    Time 8   Period Weeks   Status New   Target Date 11/23/16     PT LONG TERM GOAL #4   Title increaes FOTO score to </= 25% limited to demo improvement in function    Time 8   Period Weeks   Status New   Target Date 11/23/16     PT LONG TERM GOAL #5   Title pt to be I with all HEP given as of last visit    Time 8   Period Weeks   Status New   Target Date 11/23/16               Plan - 11/01/16 1726    Clinical Impression Statement Reports he saw the Md and that he was  doing well and had been released. no pain at the beginning of the session. progressed treatment to isotonic IR/ER and began light contraction of pec in supine. pt reported decreased tightness following soft tissue work. ice post session due to report of soreness.    PT Next Visit Plan update HEP PRN, scapular stability, shoulder AAROM, wall ladder,  manual/ IASTM PRN, scapular mobility/ GHJ mobs PRN.   PT Home Exercise Plan table slides flexion/ abduction, scapular retraction. upper trap stretching, rhomboid stretch, shoulder isometrics, I's T's Y's   Consulted and Agree with Plan of Care Patient      Patient will benefit from skilled therapeutic intervention in order to improve the following deficits and impairments:  Pain, Improper body mechanics, Postural dysfunction, Decreased strength, Increased fascial restricitons, Decreased endurance, Decreased activity tolerance, Decreased range of motion  Visit Diagnosis: Chronic right shoulder pain  Stiffness of right shoulder, not elsewhere classified  Muscle weakness (generalized)  Abnormal posture     Problem List There are no active problems to display for this patient.  Starr Lake PT, DPT, LAT, ATC  11/01/16  5:28 PM      [facility] [address] [place], [place], [address] Phone: [phone]   Fax:  [phone]  Name: Grant Hernandez MRN: [id number] Date of Birth: [birth year]

## 2016-11-05 ENCOUNTER — Encounter: Payer: Self-pay | Admitting: Physical Therapy

## 2016-11-05 ENCOUNTER — Ambulatory Visit: Payer: 59 | Admitting: Physical Therapy

## 2016-11-05 DIAGNOSIS — M6281 Muscle weakness (generalized): Secondary | ICD-10-CM | POA: Diagnosis not present

## 2016-11-05 DIAGNOSIS — R293 Abnormal posture: Secondary | ICD-10-CM

## 2016-11-05 DIAGNOSIS — G8929 Other chronic pain: Secondary | ICD-10-CM

## 2016-11-05 DIAGNOSIS — M25611 Stiffness of right shoulder, not elsewhere classified: Secondary | ICD-10-CM

## 2016-11-05 DIAGNOSIS — M25511 Pain in right shoulder: Secondary | ICD-10-CM | POA: Diagnosis not present

## 2016-11-05 DIAGNOSIS — Z0000 Encounter for general adult medical examination without abnormal findings: Secondary | ICD-10-CM | POA: Diagnosis not present

## 2016-11-05 DIAGNOSIS — E559 Vitamin D deficiency, unspecified: Secondary | ICD-10-CM | POA: Diagnosis not present

## 2016-11-05 DIAGNOSIS — G7000 Myasthenia gravis without (acute) exacerbation: Secondary | ICD-10-CM | POA: Diagnosis not present

## 2016-11-05 DIAGNOSIS — Q828 Other specified congenital malformations of skin: Secondary | ICD-10-CM | POA: Diagnosis not present

## 2016-11-05 DIAGNOSIS — Z23 Encounter for immunization: Secondary | ICD-10-CM | POA: Diagnosis not present

## 2016-11-05 DIAGNOSIS — R7309 Other abnormal glucose: Secondary | ICD-10-CM | POA: Diagnosis not present

## 2016-11-05 NOTE — Therapy (Signed)
[place], [place], [address] Phone: [phone]   Fax:  [phone]  Physical Therapy Treatment  Patient Details  Name: Grant Hernandez MRN: [id number] Date of Birth: 1975-05-30 Referring Provider: Marchia Bond Md  Encounter Date: 11/05/2016      PT End of Session - 11/05/16 1155    Visit Number 7   Number of Visits 17   Date for PT Re-Evaluation 11/23/16   PT Start Time 1101   PT Stop Time 1150   PT Time Calculation (min) 49 min   Activity Tolerance Patient tolerated treatment well   Behavior During Therapy [facility] for tasks assessed/performed      Past Medical History:  Diagnosis Date  . 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 MRSA (methicillin resistant Staphylococcus aureus) 2010   right axilla  . Myasthenia gravis ([place])   . Pectoralis muscle rupture     Past Surgical History:  Procedure Laterality Date  . PECTORALIS TENDON REPAIR Right 08/12/2016   Procedure: RIGHT PECTORALIS TENDON REPAIR;  Surgeon: Marchia Bond, MD;  Location: [place];  Service: Orthopedics;  Laterality: Right;  . THYMECTOMY  1998    There were no vitals filed for this visit.      Subjective Assessment - 11/05/16 1103    Subjective "I was sore after last session but im not feeling any pain today"    Currently in Pain? No/denies                         [facility] Adult PT Treatment/Exercise - 11/05/16 1104      Shoulder Exercises: Supine   Protraction Strengthening;15 reps;Right  CW/CCW   Other Supine Exercises sustained horizontal abduction 2 x 10 with bil shoulder flexion/ extension  with red theraband   Other Supine Exercises scapular retraction with ER 2 x 12 with red theraband     Shoulder Exercises: Standing   Other Standing Exercises middle deltoid raises 1 x 15, front deltoid raise 1 x 15,  posterior deltoid 1 x 15   Other Standing Exercises wall push up 2 x 10, Tricep press down with supinated and pronated grip 2 x 15 with green theraband.  cues to avoid arms at 90 degrees, keeping elbows by side     Shoulder Exercises: ROM/Strengthening   UBE (Upper Arm Bike) L4 x 8 min   changing direction at 4 min     Shoulder Exercises: Stretch   Corner Stretch 4 reps;30 seconds  arms at 90 degrees x 2 and 1 lower and 1 x upper                  PT Short Term Goals - 10/28/16 1723      PT SHORT TERM GOAL #1   Title pt to be I with inital HEp    Time 4   Period Weeks   Status Achieved     PT SHORT TERM GOAL #2   Title pt to increase R shoulder flexion/ abduction to >/= 120 degrees to </= 4/10 pain to promote functional mobility    Baseline 122 flexion,  abduction 88,  Pain 6/10   Time 4   Period Weeks   Status Partially Met     PT SHORT TERM GOAL #3   Title pt to demo proper posture and lifting/ carrying mechanics to prevent and reduce  pain and reinjury    Time 4   Period Weeks   Status Unable to assess           PT Long Term Goals - 09/28/16 1734      PT LONG TERM GOAL #1   Title pt will increase R shoulder flexion/ abduction to >/= 140 degrees and IR/ ER to >/= 60 degrees with </=1/10  for functional mobility required for ADLS and work related activities    Time 8   Period Weeks   Status New   Target Date 11/23/16     PT LONG TERM GOAL #2   Title pt to increase over R shoulder strength to >/= 4+/5 to provide shoulder stability with liftin and carrying activities    Time 8   Period Weeks   Status New   Target Date 11/23/16     PT LONG TERM GOAL #3   Title pt to be able to lift and lower >/= 15# from overhead shelf and push/pull >/= 20# with </=1/10 pain for functional lifting and carrying activities and work related tasks    Time 8   Period Weeks   Status New   Target Date 11/23/16     PT LONG TERM GOAL #4   Title increaes FOTO score to </=  25% limited to demo improvement in function    Time 8   Period Weeks   Status New   Target Date 11/23/16     PT LONG TERM GOAL #5   Title pt to be I with all HEP given as of last visit    Time 8   Period Weeks   Status New   Target Date 11/23/16               Plan - 11/05/16 1156    Clinical Impression Statement no report of pain. Continued working on scapular stability exercises, he performed exercises well with no report of pain. Began progression with pec activation with wall push-up which he reported no increase in pain. no pain post session.    PT Treatment/Interventions Dry needling;Manual techniques;Passive range of motion;ADLs/Self Care Home Management;Cryotherapy;Electrical Stimulation;Iontophoresis 61m/ml Dexamethasone;Moist Heat;Ultrasound;Therapeutic activities;Therapeutic exercise;Neuromuscular re-education;Patient/family education;Gait training;Taping   PT Next Visit Plan update HEP PRN, scapular stability, shoulder AAROM, wall ladder,  manual/ IASTM PRN, scapular mobility/ GHJ mobs PRN.   PT Home Exercise Plan table slides flexion/ abduction, scapular retraction. upper trap stretching, rhomboid stretch, shoulder isometrics, I's T's Y's   Consulted and Agree with Plan of Care Patient      Patient will benefit from skilled therapeutic intervention in order to improve the following deficits and impairments:  Pain, Improper body mechanics, Postural dysfunction, Decreased strength, Increased fascial restricitons, Decreased endurance, Decreased activity tolerance, Decreased range of motion  Visit Diagnosis: Chronic right shoulder pain  Stiffness of right shoulder, not elsewhere classified  Muscle weakness (generalized)  Abnormal posture     Problem List There are no active problems to display for this patient.  KStarr LakePT, DPT, LAT, ATC  11/05/16  12:03 PM      [facility] [place] [address]Phone: [phone]  Fax:  [phone] Name: CBILLY TURVEYMRN: [id number] of Birth: 8Mar 05, 1977

## 2016-11-16 ENCOUNTER — Ambulatory Visit: Payer: 59 | Attending: Orthopedic Surgery | Admitting: Physical Therapy

## 2016-11-16 ENCOUNTER — Encounter: Payer: Self-pay | Admitting: Physical Therapy

## 2016-11-16 DIAGNOSIS — M25611 Stiffness of right shoulder, not elsewhere classified: Secondary | ICD-10-CM | POA: Insufficient documentation

## 2016-11-16 DIAGNOSIS — M6281 Muscle weakness (generalized): Secondary | ICD-10-CM | POA: Diagnosis not present

## 2016-11-16 DIAGNOSIS — M25511 Pain in right shoulder: Secondary | ICD-10-CM | POA: Insufficient documentation

## 2016-11-16 DIAGNOSIS — G8929 Other chronic pain: Secondary | ICD-10-CM | POA: Diagnosis not present

## 2016-11-16 DIAGNOSIS — R293 Abnormal posture: Secondary | ICD-10-CM | POA: Insufficient documentation

## 2016-11-16 NOTE — Therapy (Signed)
[place], [place], [address] Phone: [phone]   Fax:  [phone]  Physical Therapy Treatment  Patient Details  Name: NICANDRO PERRAULT MRN: [id number] Date of Birth: [birth year] Referring Provider: Marchia Bond Md  Encounter Date: 11/16/2016      PT End of Session - 11/16/16 1719    Visit Number 8   Number of Visits 17   Date for PT Re-Evaluation 11/23/16   PT Start Time 3903   PT Stop Time 1725   PT Time Calculation (min) 50 min   Activity Tolerance Patient tolerated treatment well;No increased pain   Behavior During Therapy [facility] for tasks assessed/performed      Past Medical History:  Diagnosis Date  . 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 MRSA (methicillin resistant Staphylococcus aureus) 2010   right axilla  . Myasthenia gravis ([place])   . Pectoralis muscle rupture     Past Surgical History:  Procedure Laterality Date  . PECTORALIS TENDON REPAIR Right 08/12/2016   Procedure: RIGHT PECTORALIS TENDON REPAIR;  Surgeon: Marchia Bond, MD;  Location: [place];  Service: Orthopedics;  Laterality: Right;  . THYMECTOMY  1998    There were no vitals filed for this visit.      Subjective Assessment - 11/16/16 1637    Subjective I think i am stronger.  No pain foe the last few days.  I am doing the exercises at home.  able to snap patient's gown together without pain now.    Currently in Pain? No/denies   Pain Location Shoulder   Pain Orientation Right   Pain Descriptors / Indicators Sharp   Aggravating Factors  Waking up on right side   Pain Relieving Factors chqange of position.   Multiple Pain Sites No                         [facility] Adult PT Treatment/Exercise - 11/16/16 0001      Shoulder Exercises: Standing   Flexion Strengthening   Shoulder Flexion Weight (lbs) 0, 1   ABduction 10 reps   Shoulder ABduction Weight (lbs) 0, 1   ABduction Limitations cued for distraction with lowering.   Extension 10 reps  2 sets ,  black band   Row 10 reps  2 sets,  black band   Other Standing Exercises triceps press , black band 10 X 2 sets   Other Standing Exercises 10 x 2 sets wall pushup,   paloff press yellow band doubled in door,  10 X side right and side left.  cues initially.      Shoulder Exercises: ROM/Strengthening   UBE (Upper Arm Bike) L4 x 8 min   changing direction at 4 min     Shoulder Exercises: Stretch   Corner Stretch 4 reps;30 seconds  arms at 90 degrees x 2 and 1 lower and 1 x upper     Cryotherapy   Number Minutes Cryotherapy 10 Minutes   Cryotherapy Location Shoulder   Type of Cryotherapy --  cold pack                  PT Short Term Goals - 11/16/16 1737      PT SHORT TERM GOAL #1   Title pt to be I with inital HEp    Time 4   Period Weeks   Status Achieved  PT SHORT TERM GOAL #2   Title pt to increase R shoulder flexion/ abduction to >/= 120 degrees to </= 4/10 pain to promote functional mobility    Baseline 122 flexion,  ,  no pain   Time 4   Period Weeks   Status Partially Met     PT SHORT TERM GOAL #3   Title pt to demo proper posture and lifting/ carrying mechanics to prevent and reduce pain and reinjury    Time 4   Period Weeks   Status Unable to assess           PT Long Term Goals - 09/28/16 1734      PT LONG TERM GOAL #1   Title pt will increase R shoulder flexion/ abduction to >/= 140 degrees and IR/ ER to >/= 60 degrees with </=1/10  for functional mobility required for ADLS and work related activities    Time 8   Period Weeks   Status New   Target Date 11/23/16     PT LONG TERM GOAL #2   Title pt to increase over R shoulder strength to >/= 4+/5 to provide shoulder stability with liftin and carrying activities    Time 8   Period Weeks   Status New   Target Date 11/23/16     PT LONG TERM  GOAL #3   Title pt to be able to lift and lower >/= 15# from overhead shelf and push/pull >/= 20# with </=1/10 pain for functional lifting and carrying activities and work related tasks    Time 8   Period Weeks   Status New   Target Date 11/23/16     PT LONG TERM GOAL #4   Title increaes FOTO score to </= 25% limited to demo improvement in function    Time 8   Period Weeks   Status New   Target Date 11/23/16     PT LONG TERM GOAL #5   Title pt to be I with all HEP given as of last visit    Time 8   Period Weeks   Status New   Target Date 11/23/16               Plan - 11/16/16 1732    Clinical Impression Statement No pain today pre and post session.  Focus on strength / endurance.  Able to progress to black band for rows and shpulder extension.   Shoulder flexion    PT Treatment/Interventions Dry needling;Manual techniques;Passive range of motion;ADLs/Self Care Home Management;Cryotherapy;Electrical Stimulation;Iontophoresis 18m/ml Dexamethasone;Moist Heat;Ultrasound;Therapeutic activities;Therapeutic exercise;Neuromuscular re-education;Patient/family education;Gait training;Taping   PT Next Visit Plan update HEP PRN, scapular stability, shoulder AAROM, wall ladder,  manual/ IASTM PRN, scapular mobility/ GHJ mobs PRN.  Check abduction ROM,  measure.  practice lifting./ body mechanics.    PT Home Exercise Plan table slides flexion/ abduction, scapular retraction. upper trap stretching, rhomboid stretch, shoulder isometrics, I's T's Y's   Consulted and Agree with Plan of Care Patient      Patient will benefit from skilled therapeutic intervention in order to improve the following deficits and impairments:  Pain, Improper body mechanics, Postural dysfunction, Decreased strength, Increased fascial restricitons, Decreased endurance, Decreased activity tolerance, Decreased range of motion  Visit Diagnosis: Chronic right shoulder pain  Stiffness of right shoulder, not elsewhere  classified  Muscle weakness (generalized)  Abnormal posture     Problem List There are no active problems to display for this patient.   HARRIS,KAREN PTA 11/16/2016, 5:39 PM  [place], [place], [address] Phone: [phone]   Fax:  [phone]  Name: MONTAVIUS SUBRAMANIAM MRN: [id number] Date of Birth: 02/05/1976

## 2016-11-18 ENCOUNTER — Encounter: Payer: Self-pay | Admitting: Physical Therapy

## 2016-11-18 ENCOUNTER — Ambulatory Visit: Payer: 59 | Admitting: Physical Therapy

## 2016-11-18 DIAGNOSIS — R293 Abnormal posture: Secondary | ICD-10-CM | POA: Diagnosis not present

## 2016-11-18 DIAGNOSIS — M25511 Pain in right shoulder: Principal | ICD-10-CM

## 2016-11-18 DIAGNOSIS — M6281 Muscle weakness (generalized): Secondary | ICD-10-CM | POA: Diagnosis not present

## 2016-11-18 DIAGNOSIS — M25611 Stiffness of right shoulder, not elsewhere classified: Secondary | ICD-10-CM | POA: Diagnosis not present

## 2016-11-18 DIAGNOSIS — G8929 Other chronic pain: Secondary | ICD-10-CM | POA: Diagnosis not present

## 2016-11-18 NOTE — Therapy (Signed)
[place], [place], [address] Phone: [phone]   Fax:  [phone]  Physical Therapy Treatment  Patient Details  Name: Grant Hernandez MRN: [id number] Date of Birth: 1975-07-30 Referring Provider: Marchia Bond Md  Encounter Date: 11/18/2016      PT End of Session - 11/18/16 1727    Visit Number 9   Number of Visits 17   Date for PT Re-Evaluation 11/23/16   PT Start Time 1634   PT Stop Time 1730   PT Time Calculation (min) 56 min   Activity Tolerance Patient tolerated treatment well   Behavior During Therapy [facility] for tasks assessed/performed      Past Medical History:  Diagnosis Date  . 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 MRSA (methicillin resistant Staphylococcus aureus) 2010   right axilla  . Myasthenia gravis ([place])   . Pectoralis muscle rupture     Past Surgical History:  Procedure Laterality Date  . PECTORALIS TENDON REPAIR Right 08/12/2016   Procedure: RIGHT PECTORALIS TENDON REPAIR;  Surgeon: Marchia Bond, MD;  Location: [place];  Service: Orthopedics;  Laterality: Right;  . THYMECTOMY  1998    There were no vitals filed for this visit.      Subjective Assessment - 11/18/16 1636    Subjective I am doing OK.   No pain or soreness.   Currently in Pain? No/denies                         [facility] Adult PT Treatment/Exercise - 11/18/16 0001      Shoulder Exercises: Supine   Protraction Weight (lbs) 4   Protraction Limitations 3 sets of 10   Horizontal ABduction --  2 sets of 10  reps.   Horizontal ABduction Weight (lbs) 2   External Rotation 10 reps   Theraband Level (Shoulder External Rotation) --  black,  2 sets of 10,  with rolled towel,  supine   Internal Rotation 10 reps   Theraband Level (Shoulder Internal Rotation) --  Black band 2 sets of 10 reps , with  rolled towel supine   Shoulder Flexion Weight (lbs) 1, 2    Flexion Limitations 2 sets 10 reps.     Other Supine Exercises circles 10 x each way  3 LBS X1 way,  noted pinches,  so decreased to smaller circles and 2 LBS anle to do no pain 10 x     Shoulder Exercises: Prone   Other Prone Exercises T, Y. I 2 sets 1,2 LBS,  T,  For Y, I 0 , 1 Lbs each 2 sets of 10     Shoulder Exercises: Standing   Row 10 reps  2 sets,  black band   Other Standing Exercises triceps press , black band 10 X 2 sets   Other Standing Exercises Shelf reach,  4 levels 10 X each 1,2,3 LBS     Shoulder Exercises: ROM/Strengthening   UBE (Upper Arm Bike) L4 x 8 min   changing direction at 4 min     Cryotherapy   Number Minutes Cryotherapy 10 Minutes   Cryotherapy Location Shoulder   Type of Cryotherapy --  cold pack                  PT Short Term Goals - 11/16/16 1737      PT SHORT  TERM GOAL #1   Title pt to be I with inital HEp    Time 4   Period Weeks   Status Achieved     PT SHORT TERM GOAL #2   Title pt to increase R shoulder flexion/ abduction to >/= 120 degrees to </= 4/10 pain to promote functional mobility    Baseline 122 flexion,  ,  no pain   Time 4   Period Weeks   Status Partially Met     PT SHORT TERM GOAL #3   Title pt to demo proper posture and lifting/ carrying mechanics to prevent and reduce pain and reinjury    Time 4   Period Weeks   Status Unable to assess           PT Long Term Goals - 09/28/16 1734      PT LONG TERM GOAL #1   Title pt will increase R shoulder flexion/ abduction to >/= 140 degrees and IR/ ER to >/= 60 degrees with </=1/10  for functional mobility required for ADLS and work related activities    Time 8   Period Weeks   Status New   Target Date 11/23/16     PT LONG TERM GOAL #2   Title pt to increase over R shoulder strength to >/= 4+/5 to provide shoulder stability with liftin and carrying activities    Time 8   Period Weeks   Status  New   Target Date 11/23/16     PT LONG TERM GOAL #3   Title pt to be able to lift and lower >/= 15# from overhead shelf and push/pull >/= 20# with </=1/10 pain for functional lifting and carrying activities and work related tasks    Time 8   Period Weeks   Status New   Target Date 11/23/16     PT LONG TERM GOAL #4   Title increaes FOTO score to </= 25% limited to demo improvement in function    Time 8   Period Weeks   Status New   Target Date 11/23/16     PT LONG TERM GOAL #5   Title pt to be I with all HEP given as of last visit    Time 8   Period Weeks   Status New   Target Date 11/23/16               Plan - 11/18/16 1727    Clinical Impression Statement No pain at end of session.  Mild mid deltoid pain with supine circles.  pain decreased with smaller circles and weight.  Patient able to touch top of cabinet with 3 LB weight 10 X.   PT Next Visit Plan update HEP PRN, scapular stability, shoulder AAROM, wall ladder,  manual/ IASTM PRN, scapular mobility/ GHJ mobs PRN.  Check abduction ROM,  measure.  practice lifting./ body mechanics.    PT Home Exercise Plan table slides flexion/ abduction, scapular retraction. upper trap stretching, rhomboid stretch, shoulder isometrics, I's T's Y's   Consulted and Agree with Plan of Care Patient      Patient will benefit from skilled therapeutic intervention in order to improve the following deficits and impairments:     Visit Diagnosis: Chronic right shoulder pain  Stiffness of right shoulder, not elsewhere classified  Muscle weakness (generalized)  Abnormal posture     Problem List There are no active problems to display for this patient.   HARRIS,KAREN PTA 11/18/2016, 5:30 PM  [facility]  [place], [place], [address] Phone: [phone]   Fax:  [phone]  Name: BENTZION DAURIA MRN: [id number] Date of Birth: September 27, 1975

## 2016-11-23 ENCOUNTER — Ambulatory Visit: Payer: 59 | Admitting: Physical Therapy

## 2016-11-23 ENCOUNTER — Encounter: Payer: Self-pay | Admitting: Physical Therapy

## 2016-11-23 DIAGNOSIS — R293 Abnormal posture: Secondary | ICD-10-CM

## 2016-11-23 DIAGNOSIS — M25611 Stiffness of right shoulder, not elsewhere classified: Secondary | ICD-10-CM

## 2016-11-23 DIAGNOSIS — M6281 Muscle weakness (generalized): Secondary | ICD-10-CM

## 2016-11-23 DIAGNOSIS — M25511 Pain in right shoulder: Secondary | ICD-10-CM | POA: Diagnosis not present

## 2016-11-23 DIAGNOSIS — G8929 Other chronic pain: Secondary | ICD-10-CM

## 2016-11-23 NOTE — Therapy (Signed)
[place], [place], [address] Phone: [phone]   Fax:  [phone]  Physical Therapy Treatment  Patient Details  Name: MARLOW BERENGUER MRN: [id number] Date of Birth: May 22, 1975 Referring Provider: Marchia Bond Md  Encounter Date: 11/23/2016      PT End of Session - 11/23/16 1721    Visit Number 10   Number of Visits 17   Date for PT Re-Evaluation 11/23/16   PT Start Time 1636   PT Stop Time 6269   PT Time Calculation (min) 39 min   Activity Tolerance Patient tolerated treatment well   Behavior During Therapy [facility] for tasks assessed/performed      Past Medical History:  Diagnosis Date  . 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 MRSA (methicillin resistant Staphylococcus aureus) 2010   right axilla  . Myasthenia gravis ([place])   . Pectoralis muscle rupture     Past Surgical History:  Procedure Laterality Date  . PECTORALIS TENDON REPAIR Right 08/12/2016   Procedure: RIGHT PECTORALIS TENDON REPAIR;  Surgeon: Marchia Bond, MD;  Location: [place];  Service: Orthopedics;  Laterality: Right;  . THYMECTOMY  1998    There were no vitals filed for this visit.      Subjective Assessment - 11/23/16 1641    Subjective No pain.  I want to do anything except the arm bike.  I want a change of seanery. (SP?)   Currently in Pain? No/denies   Pain Location Shoulder   Pain Orientation Right                         [facility] Adult PT Treatment/Exercise - 11/23/16 0001      Self-Care   Other Self-Care Comments  Anatomy,  how shoulder muscles work,  what impingement is .       Shoulder Exercises: Sidelying   Theraband Level (Shoulder External Rotation) Other (comment)   External Rotation Weight (lbs) 4   External Rotation Limitations 2 sets 10 x   ABduction AROM;Strengthening;20 reps     Shoulder Exercises: Standing   Extension Weight (lbs) 7   Extension Limitations 10 x 3 sets   Other Standing Exercises biceps 13 LBS 10 x 3 sets   Other Standing Exercises Paloff press 10 x 3 sets 10,  13,  13     Shoulder Exercises: ROM/Strengthening   UBE (Upper Arm Bike) Nustep L5 6 minutes.  UE/LE.   Cybex Row Limitations 35.45.55  LBS 10 x     Shoulder Exercises: Stretch   Corner Stretch 3 reps;30 seconds   Corner Stretch Limitations arns chest  and overhead. and at hips                PT Education - 11/23/16 1721    Education provided Yes   Education Details Anatomy   Person(s) Educated Patient   Methods Explanation;Demonstration   Comprehension Verbalized understanding          PT Short Term Goals - 11/16/16 1737      PT SHORT TERM GOAL #1   Title pt to be I with inital HEp    Time 4   Period Weeks   Status Achieved     PT SHORT TERM GOAL #2   Title pt to increase R shoulder flexion/ abduction to >/= 120 degrees to </= 4/10 pain to promote functional  mobility    Baseline 122 flexion,  ,  no pain   Time 4   Period Weeks   Status Partially Met     PT SHORT TERM GOAL #3   Title pt to demo proper posture and lifting/ carrying mechanics to prevent and reduce pain and reinjury    Time 4   Period Weeks   Status Unable to assess           PT Long Term Goals - 11/23/16 1727      PT LONG TERM GOAL #4   Title increaes FOTO score to </= 25% limited to demo improvement in function    Baseline 5% limited   Time 8   Period Weeks   Status Achieved               Plan - 11/23/16 1723    Clinical Impression Statement No pain at end of session.  Notes fatigue.  He declined the need for modalities.  FOTO for 10th visit shows 5% limitation with 95% ability. LTG#4 met. Strength focus today.   PT Next Visit Plan update HEP PRN, scapular stability, shoulder AAROM, wall ladder,  manual/ IASTM PRN, scapular mobility/ GHJ mobs PRN.  Check specific goals.   Needs ERO .  POC ends today.May be ready for D/.C soon.     PT Home Exercise Plan table slides flexion/ abduction, scapular retraction. upper trap stretching, rhomboid stretch, shoulder isometrics, I's T's Y's   Consulted and Agree with Plan of Care Patient      Patient will benefit from skilled therapeutic intervention in order to improve the following deficits and impairments:     Visit Diagnosis: Chronic right shoulder pain  Stiffness of right shoulder, not elsewhere classified  Muscle weakness (generalized)  Abnormal posture     Problem List There are no active problems to display for this patient.   Dody Smartt PTA 11/23/2016, 5:28 PM  [facility] [address] [place], [place], [address] Phone: [phone]   Fax:  [phone]  Name: UGOCHUKWU CHICHESTER MRN: [id number] Date of Birth: 04-17-1975

## 2016-11-25 ENCOUNTER — Ambulatory Visit: Payer: 59 | Admitting: Physical Therapy

## 2016-11-25 ENCOUNTER — Encounter: Payer: Self-pay | Admitting: Physical Therapy

## 2016-11-25 DIAGNOSIS — M6281 Muscle weakness (generalized): Secondary | ICD-10-CM

## 2016-11-25 DIAGNOSIS — R293 Abnormal posture: Secondary | ICD-10-CM | POA: Diagnosis not present

## 2016-11-25 DIAGNOSIS — M25511 Pain in right shoulder: Secondary | ICD-10-CM | POA: Diagnosis not present

## 2016-11-25 DIAGNOSIS — G8929 Other chronic pain: Secondary | ICD-10-CM

## 2016-11-25 DIAGNOSIS — M25611 Stiffness of right shoulder, not elsewhere classified: Secondary | ICD-10-CM

## 2016-11-25 NOTE — Therapy (Signed)
[place], [place], [address] Phone: [phone]   Fax:  [phone]  Physical Therapy Treatment / Re-certification  Patient Details  Name: Grant Hernandez MRN: [id number] Date of Birth: [birth year] Referring Provider: Marchia Bond Md  Encounter Date: 11/25/2016      PT End of Session - 11/25/16 1644    Visit Number 11   Number of Visits 17   Date for PT Re-Evaluation 01/06/17   PT Start Time 1640   PT Stop Time 1725   PT Time Calculation (min) 45 min   Activity Tolerance Patient tolerated treatment well   Behavior During Therapy [facility] for tasks assessed/performed      Past Medical History:  Diagnosis Date  . 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 MRSA (methicillin resistant Staphylococcus aureus) 2010   right axilla  . Myasthenia gravis ([place])   . Pectoralis muscle rupture     Past Surgical History:  Procedure Laterality Date  . PECTORALIS TENDON REPAIR Right 08/12/2016   Procedure: RIGHT PECTORALIS TENDON REPAIR;  Surgeon: Marchia Bond, MD;  Location: [facility];  Service: Orthopedics;  Laterality: Right;  . THYMECTOMY  1998    There were no vitals filed for this visit.      Subjective Assessment - 11/25/16 1643    Subjective "I was wrestling with my son and had some pain inthe shoulder, no pain today"    Currently in Pain? No/denies   Pain Score 0-No pain            [facility] PT Assessment - 11/25/16 1647      AROM   Right Shoulder Extension 70 Degrees   Right Shoulder Flexion 151 Degrees   Right Shoulder ABduction 138 Degrees   Right Shoulder Internal Rotation 62 Degrees  90/90   Right Shoulder External Rotation 80 Degrees  90/90     Strength   Right Shoulder Flexion 5/5   Right Shoulder Extension 5/5   Right Shoulder ABduction 5/5   Right Shoulder Internal Rotation 4/5  no pain  during test   Right Shoulder External Rotation 5/5   Left Shoulder Flexion 5/5   Left Shoulder Extension 5/5   Left Shoulder ABduction 5/5   Left Shoulder Internal Rotation 5/5   Left Shoulder External Rotation 5/5   Right Hand Grip (lbs) 122.6  128,120,120                     [facility] Adult PT Treatment/Exercise - 11/25/16 1658      Shoulder Exercises: Standing   Horizontal ABduction Strengthening;Both;15 reps;Theraband  x 2 sets   Theraband Level (Shoulder Horizontal ABduction) Level 3 (Green)   Other Standing Exercises counter push-ups followed with shoulder press starting 1x1, 2x2, and 3x3 to 10 reps each. cues for control   Other Standing Exercises posterior deltoid and tricep strength 1 x 12 7#, 1 x 14 10# bil     Shoulder Exercises: ROM/Strengthening   UBE (Upper Arm Bike) L6 x 4 min  changing direction at 2 min     Shoulder Exercises: Stretch   Corner Stretch 2 reps;30 seconds   Corner Stretch Limitations 1 arms at horizontal position, 1 with hands by hips                  PT Short Term Goals - 11/25/16 1654      PT SHORT  TERM GOAL #1   Title pt to be I with inital HEp    Time 4   Period Weeks   Status Achieved     PT SHORT TERM GOAL #2   Title pt to increase R shoulder flexion/ abduction to >/= 120 degrees to </= 4/10 pain to promote functional mobility    Time 4   Period Weeks   Status Achieved     PT SHORT TERM GOAL #3   Title pt to demo proper posture and lifting/ carrying mechanics to prevent and reduce pain and reinjury    Time 4   Period Weeks   Status Achieved           PT Long Term Goals - 11/25/16 1656      PT LONG TERM GOAL #1   Title pt will increase R shoulder flexion/ abduction to >/= 140 degrees and IR/ ER to >/= 60 degrees with </=1/10  for functional mobility required for ADLS and work related activities    Baseline abduction 138   Period Weeks   Status Partially Met     PT LONG TERM GOAL #2   Title pt to  increase over R shoulder strength to >/= 4+/5 to provide shoulder stability with liftin and carrying activities    Baseline internal rotation was 4/5   Time 8   Period Weeks   Status Partially Met     PT LONG TERM GOAL #3   Title pt to be able to lift and lower >/= 15# from overhead shelf and push/pull >/= 20# with </=1/10 pain for functional lifting and carrying activities and work related tasks    Time 8   Period Weeks     PT LONG TERM GOAL #4   Title increaes FOTO score to </= 25% limited to demo improvement in function    Baseline 5% limited   Period Weeks   Status Achieved     PT LONG TERM GOAL #5   Title pt to be I with all HEP given as of last visit    Time 8   Period Weeks   Status Partially Met               Plan - 11/25/16 1726    Clinical Impression Statement Nicole Kindred continues to make progress with therapy increasing shoulder mobility and strength. he reports minimal soreness intermittently with activities. he was able to perform all exercises today with minimal soreness with counter push-ups.  Plan to continued with current POC to promote strength, and functional activites/ lifting, and work toward remaining goals and independent exercise.    PT Frequency 1x / week   PT Duration 4 weeks   PT Treatment/Interventions Dry needling;Manual techniques;Passive range of motion;ADLs/Self Care Home Management;Cryotherapy;Electrical Stimulation;Iontophoresis 1m/ml Dexamethasone;Moist Heat;Ultrasound;Therapeutic activities;Therapeutic exercise;Neuromuscular re-education;Patient/family education;Gait training;Taping   PT Next Visit Plan begin gradual gym exercises, progress / update HEP    PT Home Exercise Plan table slides flexion/ abduction, scapular retraction. upper trap stretching, rhomboid stretch, shoulder isometrics, I's T's Y's   Consulted and Agree with Plan of Care Patient      Patient will benefit from skilled therapeutic intervention in order to improve the  following deficits and impairments:  Pain, Improper body mechanics, Postural dysfunction, Decreased strength, Increased fascial restricitons, Decreased endurance, Decreased activity tolerance, Decreased range of motion  Visit Diagnosis: Chronic right shoulder pain - Plan: PT plan of care cert/re-cert  Stiffness of right shoulder, not elsewhere classified - Plan: PT plan of care  cert/re-cert  Abnormal posture - Plan: PT plan of care cert/re-cert  Muscle weakness (generalized) - Plan: PT plan of care cert/re-cert     Problem List There are no active problems to display for this patient.  Starr Lake PT, DPT, LAT, ATC  11/25/16  5:33 PM      [facility] [address] [place], [place], [address] Phone: [phone]   Fax:  [phone]  Name: RIELY BASKETT MRN: [id number] Date of Birth: 07/27/1975

## 2016-11-30 ENCOUNTER — Ambulatory Visit: Payer: 59 | Admitting: Physical Therapy

## 2016-12-02 ENCOUNTER — Encounter: Payer: Self-pay | Admitting: Physical Therapy

## 2016-12-02 ENCOUNTER — Ambulatory Visit: Payer: 59 | Admitting: Physical Therapy

## 2016-12-02 DIAGNOSIS — R293 Abnormal posture: Secondary | ICD-10-CM | POA: Diagnosis not present

## 2016-12-02 DIAGNOSIS — M25611 Stiffness of right shoulder, not elsewhere classified: Secondary | ICD-10-CM

## 2016-12-02 DIAGNOSIS — M25511 Pain in right shoulder: Secondary | ICD-10-CM | POA: Diagnosis not present

## 2016-12-02 DIAGNOSIS — M6281 Muscle weakness (generalized): Secondary | ICD-10-CM

## 2016-12-02 DIAGNOSIS — G8929 Other chronic pain: Secondary | ICD-10-CM | POA: Diagnosis not present

## 2016-12-02 NOTE — Therapy (Signed)
[place], [place], [address] Phone: [phone]   Fax:  [phone]  Physical Therapy Treatment  Patient Details  Name: Grant Hernandez MRN: [id number] Date of Birth: 1975/09/03 Referring Provider: Marchia Bond Md  Encounter Date: 12/02/2016      PT End of Session - 12/02/16 1724    Visit Number 12   Number of Visits 17   Date for PT Re-Evaluation 01/06/17   PT Start Time 1638   PT Stop Time 1720   PT Time Calculation (min) 42 min   Activity Tolerance Patient tolerated treatment well   Behavior During Therapy [facility] for tasks assessed/performed      Past Medical History:  Diagnosis Date  . 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 MRSA (methicillin resistant Staphylococcus aureus) 2010   right axilla  . Myasthenia gravis ([place])   . Pectoralis muscle rupture     Past Surgical History:  Procedure Laterality Date  . PECTORALIS TENDON REPAIR Right 08/12/2016   Procedure: RIGHT PECTORALIS TENDON REPAIR;  Surgeon: Marchia Bond, MD;  Location: [facility];  Service: Orthopedics;  Laterality: Right;  . THYMECTOMY  1998    There were no vitals filed for this visit.      Subjective Assessment - 12/02/16 1644    Subjective "Not really having issues, I did have some issues with pluging an item in"    Currently in Pain? No/denies   Pain Score 0-No pain                         [facility] Adult PT Treatment/Exercise - 12/02/16 1646      Shoulder Exercises: Standing   Other Standing Exercises Low Row 1 x 10 10#, 1 x 10 15#, 1 x 10 25#   Other Standing Exercises bil horizontal adduction 2 x 10 pusing hands together with 5# and protracting and retracting     Shoulder Exercises: Stretch   Corner Stretch 2 reps;30 seconds   Other Shoulder Stretches 3 way bicep stretch 30 sec hold each     Shoulder  Exercises: Power Tower   Row --  Rows 1 x 12 35#, 1 x 12 40#   Other Power Engineer, water Lat pulldown 1 x 12 35#, 1 x 12 45# with eccentric length x 5 sec    Other Power Tower Exercises chest press 3 x 12, 1set at 5#, 10# and 15#     Shoulder Exercises: Body Blade   Other Body Blade Exercises IR/ER 3 x 30   Other Body Blade Exercises bicep curl 3 x 30 sec, shoulder flexion/ horizontal abduct 2 x 20 sec                   PT Short Term Goals - 11/25/16 1654      PT SHORT TERM GOAL #1   Title pt to be I with inital HEp    Time 4   Period Weeks   Status Achieved     PT SHORT TERM GOAL #2   Title pt to increase R shoulder flexion/ abduction to >/= 120 degrees to </= 4/10 pain to promote functional mobility    Time 4   Period Weeks   Status Achieved     PT SHORT TERM GOAL #3   Title pt to demo proper posture and lifting/ carrying mechanics to prevent  and reduce pain and reinjury    Time 4   Period Weeks   Status Achieved           PT Long Term Goals - 11/25/16 1656      PT LONG TERM GOAL #1   Title pt will increase R shoulder flexion/ abduction to >/= 140 degrees and IR/ ER to >/= 60 degrees with </=1/10  for functional mobility required for ADLS and work related activities    Baseline abduction 138   Period Weeks   Status Partially Met     PT LONG TERM GOAL #2   Title pt to increase over R shoulder strength to >/= 4+/5 to provide shoulder stability with liftin and carrying activities    Baseline internal rotation was 4/5   Time 8   Period Weeks   Status Partially Met     PT LONG TERM GOAL #3   Title pt to be able to lift and lower >/= 15# from overhead shelf and push/pull >/= 20# with </=1/10 pain for functional lifting and carrying activities and work related tasks    Time 8   Period Weeks     PT LONG TERM GOAL #4   Title increaes FOTO score to </= 25% limited to demo improvement in function    Baseline 5% limited   Period Weeks   Status Achieved      PT LONG TERM GOAL #5   Title pt to be I with all HEP given as of last visit    Time 8   Period Weeks   Status Partially Met               Plan - 12/02/16 1724    Clinical Impression Statement no report of pain today with minimal soreness noted with plugging an item in to the wall. continued focus on strengthening the shoulder and began lite chest press monitoring for pain throughout session. pt performed exercises well with no report of pain post session.    PT Next Visit Plan continued gym exercises, progress / update HEP    PT Home Exercise Plan table slides flexion/ abduction, scapular retraction. upper trap stretching, rhomboid stretch, shoulder isometrics, I's T's Y's   Consulted and Agree with Plan of Care Patient      Patient will benefit from skilled therapeutic intervention in order to improve the following deficits and impairments:  Pain, Improper body mechanics, Postural dysfunction, Decreased strength, Increased fascial restricitons, Decreased endurance, Decreased activity tolerance, Decreased range of motion  Visit Diagnosis: Chronic right shoulder pain  Stiffness of right shoulder, not elsewhere classified  Abnormal posture  Muscle weakness (generalized)     Problem List There are no active problems to display for this patient.  Starr Lake PT, DPT, LAT, ATC  12/02/16  5:26 PM      [facility] [address] [place], [place], [address] Phone: [phone]   Fax:  [phone]  Name: Grant Hernandez MRN: [id number] Date of Birth: 23-Feb-1976

## 2016-12-14 MED FILL — PYRIDOSTIGMINE BR 60 MG TAB: 60 | 90 days supply | Qty: 405 | Fill #0

## 2016-12-16 ENCOUNTER — Encounter: Payer: 59 | Admitting: Physical Therapy

## 2016-12-21 ENCOUNTER — Encounter: Payer: Self-pay | Admitting: Physical Therapy

## 2016-12-21 ENCOUNTER — Ambulatory Visit: Payer: 59 | Attending: Orthopedic Surgery | Admitting: Physical Therapy

## 2016-12-21 DIAGNOSIS — M25511 Pain in right shoulder: Secondary | ICD-10-CM | POA: Insufficient documentation

## 2016-12-21 DIAGNOSIS — M6281 Muscle weakness (generalized): Secondary | ICD-10-CM | POA: Diagnosis not present

## 2016-12-21 DIAGNOSIS — M25611 Stiffness of right shoulder, not elsewhere classified: Secondary | ICD-10-CM | POA: Diagnosis not present

## 2016-12-21 DIAGNOSIS — G8929 Other chronic pain: Secondary | ICD-10-CM | POA: Insufficient documentation

## 2016-12-21 DIAGNOSIS — R293 Abnormal posture: Secondary | ICD-10-CM | POA: Insufficient documentation

## 2016-12-21 NOTE — Therapy (Addendum)
[place], [place], [address] Phone: [phone]   Fax:  [phone]  Physical Therapy Treatment / Discharge summary  Patient Details  Name: Grant Hernandez MRN: [id number] Date of Birth: Jan 15, 1976 Referring Provider: Marchia Bond Md  Encounter Date: 12/21/2016      PT End of Session - 12/21/16 1750    Visit Number 13   Number of Visits 17   Date for PT Re-Evaluation 01/06/17   PT Start Time 7253   PT Stop Time 6644   PT Time Calculation (min) 58 min   Activity Tolerance Patient tolerated treatment well   Behavior During Therapy [facility] for tasks assessed/performed      Past Medical History:  Diagnosis Date  . 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 MRSA (methicillin resistant Staphylococcus aureus) 2010   right axilla  . Myasthenia gravis ([place])   . Pectoralis muscle rupture     Past Surgical History:  Procedure Laterality Date  . PECTORALIS TENDON REPAIR Right 08/12/2016   Procedure: RIGHT PECTORALIS TENDON REPAIR;  Surgeon: Marchia Bond, MD;  Location: [facility];  Service: Orthopedics;  Laterality: Right;  . THYMECTOMY  1998    There were no vitals filed for this visit.      Subjective Assessment - 12/21/16 1546    Subjective I have been doing better with plugging things in to the wall and snapping patient's gown,  Able to open jars OK.    I want to work on overhead presses.     Currently in Pain? No/denies   Pain Location Shoulder            [facility] PT Assessment - 12/21/16 1744      AROM   Right Shoulder Flexion 146 Degrees   Right Shoulder ABduction 142 Degrees   Right Shoulder Internal Rotation --  Supine IR  WNL   Right Shoulder External Rotation 80 Degrees                     [facility] Adult PT Treatment/Exercise - 12/21/16 0001      Therapeutic Activites    Therapeutic Activities Lifting;Work Art therapist 3 x 15.5 LB bow onto  and off overhead shelf  without pain   Work Simulation sled ,  pull/push 20+ feet with no increased pain.     Shoulder Exercises: Standing   Other Standing Exercises Low Row 1 x 10 10#, 1 x 10 15#, 1 x 10 25#   Other Standing Exercises bil horizontal adduction 2 x 10 pusing hands together with 5# and protracting and retracting     Shoulder Exercises: Stretch   Corner Stretch 3 reps;30 seconds  3 positions   Other Shoulder Stretches 3 way bicep stretch 30 sec hold each     Shoulder Exercises: Power Tower   Row --  Rows 1 x 12 35#, 1 x 12 40#   Other Power Engineer, water Lat pulldown 1 x 12 35#, 1 x 12 45# with eccentric length x 5 sec    Other Power Tower Exercises chest press 3 x 12, 1set at 5#, 10# and 15#  cued to avoid snapping elbows straight.     Shoulder Exercises: Body Blade   Other Body Blade Exercises IR/ER 3 x 30  cued to keep wrist stiff   Other Body Blade Exercises bicep curl 3 x 30 sec,  shoulder flexion/ horizontal abduct 2 x 20 sec   cue for pole position     Cryotherapy   Number Minutes Cryotherapy 10 Minutes   Cryotherapy Location Shoulder   Type of Cryotherapy --  cold pack                PT Education - 12/21/16 1749    Education provided Yes   Education Details Use only light weights in the gym   Person(s) Educated Patient   Methods Explanation   Comprehension Verbalized understanding          PT Short Term Goals - 11/25/16 1654      PT SHORT TERM GOAL #1   Title pt to be I with inital HEp    Time 4   Period Weeks   Status Achieved     PT SHORT TERM GOAL #2   Title pt to increase R shoulder flexion/ abduction to >/= 120 degrees to </= 4/10 pain to promote functional mobility    Time 4   Period Weeks   Status Achieved     PT SHORT TERM GOAL #3   Title pt to demo proper posture and lifting/ carrying mechanics to prevent and reduce pain and reinjury     Time 4   Period Weeks   Status Achieved           PT Long Term Goals - 12/21/16 1752      PT LONG TERM GOAL #1   Title pt will increase R shoulder flexion/ abduction to >/= 140 degrees and IR/ ER to >/= 60 degrees with </=1/10  for functional mobility required for ADLS and work related activities    Baseline yes, see flow sheet   Time 8   Period Weeks   Status Achieved     PT LONG TERM GOAL #2   Title pt to increase over R shoulder strength to >/= 4+/5 to provide shoulder stability with liftin and carrying activities    Baseline did not formally assess   Time 8   Period Weeks   Status Partially Met     PT LONG TERM GOAL #3   Title pt to be able to lift and lower >/= 15# from overhead shelf and push/pull >/= 20# with </=1/10 pain for functional lifting and carrying activities and work related tasks    Baseline Able to do ,  no pain   Time 8   Period Weeks   Status Achieved     PT LONG TERM GOAL #4   Title increaes FOTO score to </= 25% limited to demo improvement in function    Baseline 10 % limitation   Time 8   Period Weeks   Status Achieved     PT LONG TERM GOAL #5   Title pt to be I with all HEP given as of last visit    Baseline independent   Time 8   Period Weeks   Status Achieved               Plan - 12/21/16 1750    Clinical Impression Statement Patient feels he is ready for discharge.  All goals met except MMT di not get tested.  FOTO 10 % limitation.  He is independent with HEP.  he was cautiond to use only light weights in the gym, and to do only the things we have done in the clinic.Marland Kitchen   PT Next Visit Plan Discharge,  patient's request   PT Home Exercise Plan  table slides flexion/ abduction, scapular retraction. upper trap stretching, rhomboid stretch, shoulder isometrics, I's T's Y's   Consulted and Agree with Plan of Care Patient      Patient will benefit from skilled therapeutic intervention in order to improve the following deficits and  impairments:     Visit Diagnosis: Chronic right shoulder pain  Stiffness of right shoulder, not elsewhere classified  Abnormal posture  Muscle weakness (generalized)     Problem List There are no active problems to display for this patient.   HARRIS,KAREN PTA 12/21/2016, 5:55 PM  [facility] [address] [place], [place], [address] Phone: [phone]   Fax:  [phone]  Name: Grant Hernandez MRN: [id number] Date of Birth: Jul 02, 1975       PHYSICAL THERAPY DISCHARGE SUMMARY  Visits from Start of Care: 13  Current functional level related to goals / functional outcomes: See goals, FOTO 10% limited   Remaining deficits: See above assessment   Education / Equipment: HEP, theraband, posture, lifting progression  Plan: Patient agrees to discharge.  Patient goals were met. Patient is being discharged due to meeting the stated rehab goals.  ?????     Kristoffer Leamon PT, DPT, LAT, ATC  12/23/16  8:41 AM

## 2016-12-30 ENCOUNTER — Encounter: Payer: 59 | Admitting: Physical Therapy

## 2017-01-17 ENCOUNTER — Encounter: Payer: Self-pay | Admitting: Podiatry

## 2017-01-17 ENCOUNTER — Ambulatory Visit (INDEPENDENT_AMBULATORY_CARE_PROVIDER_SITE_OTHER): Payer: 59

## 2017-01-17 ENCOUNTER — Ambulatory Visit: Payer: 59 | Admitting: Podiatry

## 2017-01-17 VITALS — BP 142/79 | HR 65 | Resp 16

## 2017-01-17 DIAGNOSIS — M779 Enthesopathy, unspecified: Secondary | ICD-10-CM | POA: Diagnosis not present

## 2017-01-17 DIAGNOSIS — M659 Synovitis and tenosynovitis, unspecified: Secondary | ICD-10-CM | POA: Diagnosis not present

## 2017-01-17 MED ORDER — MELOXICAM 15 MG PO TABS: 15.0000 mg | ORAL_TABLET | Freq: Every day | ORAL | 2 refills | Status: DC

## 2017-01-17 NOTE — Patient Instructions (Signed)
Posterior Tibialis Tendinosis Rehab Ask your health care provider which exercises are safe for you. Do exercises exactly as told by your health care provider and adjust them as directed. It is normal to feel mild stretching, pulling, tightness, or discomfort as you do these exercises, but you should stop right away if you feel sudden pain or your pain gets worse.Do not begin these exercises until told by your health care provider. Stretching and range of motion exercises These exercises warm up your muscles and joints and improve the movement and flexibility in your ankle and foot. These exercises may also help to relieve pain. Exercise A: Standing wall calf stretch, knee straight  1. Stand with your hands against a wall. 2. Extend your __________ leg behind you, and bend your front knee slightly. Keep both of your heels on the floor. 3. Point the toes of your back foot slightly inward. 4. Keeping your heels on the floor and your back knee straight, shift your weight toward the wall. Do not allow your back to arch. You should feel a gentle stretch in the back of your lower leg (calf). 5. Hold this position for __________ seconds. Repeat __________ times. Complete this stretch __________ times a day. Exercise B: Standing wall calf stretch, knee bent 1. Stand with your hands against a wall. 2. Extend your __________ leg behind you, and bend your front knee slightly. Keep both of your heels on the floor. 3. Point the toes of your back foot slightly inward. 4. Unlock your back knee so it is bent. Keep your heels on the floor. You should feel a gentle stretch deep in your calf. 5. Hold this position for __________ seconds. Repeat __________ times. Complete this exercise __________ times a day. Strengthening exercises These exercises build strength and endurance in your ankle and foot. Endurance is the ability to use your muscles for a long time, even after they get tired. Exercise C: Ankle inversion  with band 1. Secure one end of an exercise band or tubing to a fixed object, such as a table leg or a pole, that will stay still when the band is pulled. to an object that will not move if it is pulled on, like a table leg. 2. Loop the other end of the band around the middle of your left / right foot. 3. Sit on the floor facing the object with your __________ leg extended. The band or tube should be slightly tense when your foot is relaxed. 4. Leading with your big toe, slowly bring your __________ foot and ankle inward, toward your other foot. 5. Hold this position for __________ seconds. 6. Slowly return your foot to the starting position. Repeat __________ times. Complete this exercise __________ times a day. Exercise D: Towel curls  1. Sit in a chair on a non-carpeted surface, and put your feet on the floor. 2. Place a towel in front of your feet. If told by your health care provider, add __________ at the end of the towel. 3. Keeping your heel on the floor, put your __________ foot on the towel. 4. Pull the towel toward you by grabbing the towel with your toes and curling them under. Keep your heel on the floor. 5. Let your toes relax. 6. Grab the towel with your toes again. Keep going until the towel is completely underneath your foot. Repeat __________ times. Complete this exercise __________ times a day. Balance exercises These exercises improve or maintain your balance. Balance is important in preventing falls.   Exercise E: Single leg stand 1. Without shoes, stand near a railing or in a doorway. You can hold on to the railing or door frame as needed for balance. 2. Stand on your __________ foot. Keep your big toe down on the floor and try to keep your arch lifted. If balancing in this position is too easy, try the exercise with your eyes closed or while standing on a pillow. 3. Hold this position for __________ seconds. Repeat __________ times. Complete this exercise __________ times a  day. This information is not intended to replace advice given to you by your health care provider. Make sure you discuss any questions you have with your health care provider. Document Released: 02/22/2005 Document Revised: 10/28/2015 Document Reviewed: 11/08/2014 [employer] Interactive Patient Education  2018 [employer]  

## 2017-01-19 NOTE — Progress Notes (Signed)
Subjective:    Patient ID: Grant Hernandez, male   DOB: 41 y.o.   MRN: [id number]   HPI  41 year old male presents the office today for concerns of pain to the right ankle as well as the arch which is been ongoing since last Wednesday.  He states that it hurts when she is sitting down for some time and stands back up and he points the medial ankle when he gets the majority of symptoms.  He states that at worst the pain level is 10/10 after he walks for some time it starts to ease up.  He denies any numbness or tingling he denies any swelling.  He said no recent treatment for this.  No specific injury that he can recall.  He has no other concerns.  He works an [employer] at [employer] other systems reviewed and are negative.  Past Medical History:  Diagnosis Date  . 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 MRSA (methicillin resistant Staphylococcus aureus) 2010   right axilla  . Myasthenia gravis ([place])   . Pectoralis muscle rupture     Past Surgical History:  Procedure Laterality Date  . THYMECTOMY  1998     Current Outpatient Medications:  .  meloxicam (MOBIC) 15 MG tablet, Take 1 tablet (15 mg total) daily by mouth., Disp: 30 tablet, Rfl: 2 .  Pyridostigmine Bromide (MESTINON PO), Take 60 mg by mouth 2 (two) times daily. , Disp: , Rfl:   No Known Allergies  Social History   Socioeconomic History  . Marital status: Married    Spouse name: Not on file  . Number of children: Not on file  . Years of education: Not on file  . Highest education level: Not on file  Social Needs  . Financial resource strain: Not on file  . Food insecurity - worry: Not on file  . Food insecurity - inability: Not on file  . Transportation needs - medical: Not on file  . Transportation needs - non-medical: Not on file  Occupational History  . Not on file  Tobacco Use  . Smoking status: Never  Smoker  . Smokeless tobacco: Never Used  Substance and Sexual Activity  . Alcohol use: Yes    Comment: social  . Drug use: No  . Sexual activity: Yes    Birth control/protection: Condom  Other Topics Concern  . Not on file  Social History Narrative  . Not on file         Objective:  Physical Exam General: AAO x3, NAD  Dermatological: Skin is warm, dry and supple bilateral. Nails x 10 are well manicured; remaining integument appears unremarkable at this time. There are no open sores, no preulcerative lesions, no rash or signs of infection present.  Vascular: Dorsalis Pedis artery and Posterior Tibial artery pedal pulses are 2/4 bilateral with immedate capillary fill time.  There is no pain with calf compression, swelling, warmth, erythema.   Neruologic: Grossly intact via light touch bilateral.  Protective threshold with Semmes Wienstein monofilament intact to all pedal sites bilateral.  Negative Tinel sign present.  Musculoskeletal:  there is tenderness to palpation on the medial aspect of the patient's right ankle in the course of posterior tibial tendon and just proximal to the navicular tuberosity.  The tendon appears to be intact.  Ankle, subtalar joint range of motion intact.  Achilles tendon intact.  There is no pain on the plantar fascial insertion today.  There is no other area of tenderness identified.  There is no overlying edema, erythema, increased warmth.  Muscular strength 5/5 in all groups tested bilateral.  Gait: Unassisted, Nonantalgic.      Assessment:     41 year old male with posterior tibial tendinitis    Plan:     -Treatment options discussed including all alternatives, risks, and complications -Etiology of symptoms were discussed -X-rays were obtained and reviewed with the patient.  There is no evidence of acute fracture or stress fracture identified today. -Prescribed mobic. Discussed side effects of the medication and directed to stop if any are to  occur and call the office.  -Tri-Lock ankle brace was dispensed -Rehab, stretching exercises discussed -Discussed shoe gear modifications  Trula Slade DPM

## 2017-02-18 ENCOUNTER — Ambulatory Visit: Payer: 59 | Admitting: Podiatry

## 2017-03-11 DIAGNOSIS — R21 Rash and other nonspecific skin eruption: Secondary | ICD-10-CM | POA: Diagnosis not present

## 2017-03-11 DIAGNOSIS — E559 Vitamin D deficiency, unspecified: Secondary | ICD-10-CM | POA: Diagnosis not present

## 2017-03-11 DIAGNOSIS — R7309 Other abnormal glucose: Secondary | ICD-10-CM | POA: Diagnosis not present

## 2017-03-11 DIAGNOSIS — Z9114 Patient's other noncompliance with medication regimen: Secondary | ICD-10-CM | POA: Diagnosis not present

## 2017-03-11 DIAGNOSIS — G7000 Myasthenia gravis without (acute) exacerbation: Secondary | ICD-10-CM | POA: Diagnosis not present

## 2017-03-11 MED FILL — VIT D2 1.25 MG (50,000 UNIT: 1.25 MG | 84 days supply | Qty: 24 | Fill #0

## 2017-03-11 MED FILL — NYSTATIN 100,000 UNIT/GM CR: 100000 | 15 days supply | Qty: 30 | Fill #0

## 2017-04-21 MED FILL — PYRIDOSTIGMINE BR 60 MG TAB: 60 | 90 days supply | Qty: 405 | Fill #1

## 2017-05-04 DIAGNOSIS — G7000 Myasthenia gravis without (acute) exacerbation: Secondary | ICD-10-CM | POA: Diagnosis not present

## 2017-08-10 DIAGNOSIS — R51 Headache: Secondary | ICD-10-CM | POA: Diagnosis not present

## 2017-08-10 DIAGNOSIS — R030 Elevated blood-pressure reading, without diagnosis of hypertension: Secondary | ICD-10-CM | POA: Diagnosis not present

## 2017-08-10 DIAGNOSIS — E669 Obesity, unspecified: Secondary | ICD-10-CM | POA: Diagnosis not present

## 2017-08-10 DIAGNOSIS — Z6841 Body Mass Index (BMI) 40.0 and over, adult: Secondary | ICD-10-CM | POA: Diagnosis not present

## 2017-10-17 ENCOUNTER — Encounter: Payer: Self-pay | Admitting: Neurology

## 2017-10-18 ENCOUNTER — Encounter: Payer: Self-pay | Admitting: Neurology

## 2017-10-18 ENCOUNTER — Ambulatory Visit (INDEPENDENT_AMBULATORY_CARE_PROVIDER_SITE_OTHER): Payer: 59 | Admitting: Neurology

## 2017-10-18 VITALS — BP 141/89 | HR 62 | Ht 70.0 in | Wt 308.0 lb

## 2017-10-18 DIAGNOSIS — G44019 Episodic cluster headache, not intractable: Secondary | ICD-10-CM

## 2017-10-18 DIAGNOSIS — R5383 Other fatigue: Secondary | ICD-10-CM

## 2017-10-18 DIAGNOSIS — G4719 Other hypersomnia: Secondary | ICD-10-CM

## 2017-10-18 DIAGNOSIS — E661 Drug-induced obesity: Secondary | ICD-10-CM | POA: Diagnosis not present

## 2017-10-18 DIAGNOSIS — G7000 Myasthenia gravis without (acute) exacerbation: Secondary | ICD-10-CM | POA: Diagnosis not present

## 2017-10-18 DIAGNOSIS — Z6841 Body Mass Index (BMI) 40.0 and over, adult: Secondary | ICD-10-CM

## 2017-10-18 DIAGNOSIS — T380X5A Adverse effect of glucocorticoids and synthetic analogues, initial encounter: Secondary | ICD-10-CM

## 2017-10-18 DIAGNOSIS — R739 Hyperglycemia, unspecified: Secondary | ICD-10-CM

## 2017-10-18 NOTE — Progress Notes (Signed)
SLEEP MEDICINE CLINIC   Provider:  Larey Hernandez, M D  Primary Care Physician:  Grant Hernandez   Referring Provider: [name], FNP   Chief Complaint  Patient presents with  . New Patient (Initial Visit)    pt alone, rm 11, pt states that he wakes up with headaches,that he is unable to get a good nights sleep, states that during the day he may fall asleep . pt has MG and obesity, diabetes and  HTN . PCP wanted to get eval for sleep apnea. sleep study was completed over 10 years ago and was negative for apnea at that time.     HPI:  Grant Hernandez is a 42 y.o. male patient , seen here as in a referral from NP [facility] for a new evaluation of sleep apnea.   The patient carried the diagnosis of myasthenia gravis for 21 years, followed by Dr. Nanine Hernandez at [facility].  Grant Hernandez had seen me about 10 - 15 years ago for a sleep evaluation and at that time was not diagnosed with obstructive sleep apnea.  However he now wakes up with headaches which was not the case in the past, he also still is overweight, has diabetes now and hypertension.  While his myasthenia gravis is well controlled there are multiple risk factors for the presence of sleep apnea and sleep apnea could be a contributing factor to these morning headaches.  Sleep habits are as follows:  Dinner time is around 7 PM and he watches TV before he goes to bed, he retreats at 9 Pm to watch TV in the bedroom and is asleep by 10.00. His wife insists on the TV. Wife Grant Hernandez works at [employer].  Grant Hernandez reports that the bedroom was never really quiet there is either the TV or his wife snoring, but it is cool and usually dark after the TV goes off.  Once he is asleep he can stay asleep for 4 to 5 hours, he may wake up once in the middle of the night but usually can go back to sleep.  His wife has to rise at 4:30 in the morning which usually wakes him up, too.  The alarm is set for 4:30 AM.  The patient prefers to sleep on his left side, sometimes on his  back sometimes prone, he cannot tolerate sleep on the right side since he had a shoulder injury and surgery.  He sleeps on 2 pillows.  The bed is not adjustable.  The patient does not report any nocturia, he does not wake up usually from pain shortness of breath, but has snored himself awake when sleeping reclined on the couch.  Averages 6 hours of sleep at night. Headaches wake him (these are pounding but sharp and stabbing, may sometimes feel as if he is stepped through the eye) and he wakes with headaches that are more slight, more dull sometimes throbbing and more affecting the temple.  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  He rarely takes naps ( 30 minute)  because he wakes with a headache.   Sleep medical history and family sleep history:  MG, DM after steroid treatment - HTN, obesity .   Social history: Wife Grant Hernandez works at [employer], I met her when she was pregnant with their first child. 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   Review of Systems: Out of a complete 14 system review, the patient complains of only the following symptoms, and all other reviewed systems are negative.    Snoring, headaches, ptosis, asleep when not physically or mentally stimulated.   Epworth score  16/ 24  , Fatigue severity score 42 / 63   , depression n/a   Social History   Socioeconomic History  . Marital status: Married    Spouse name: Not on file  . Number of children: Not on file  . Years of education: Not on file  . Highest education level: Not on file  Occupational History  . Not on file  Social Needs  . Financial resource strain: Not on file  . Food insecurity:    Worry: Not on file    Inability: Not on file  . Transportation needs:    Medical: Not on file    Non-medical: Not on file  Tobacco Use  . Smoking status: Never Smoker  . Smokeless  tobacco: Never Used  Substance and Sexual Activity  . Alcohol use: Yes    Comment: social  . Drug use: No  . Sexual activity: Yes    Birth control/protection: Condom  Lifestyle  . Physical activity:    Days per week: Not on file    Minutes per session: Not on file  . Stress: Not on file  Relationships  . Social connections:    Talks on phone: Not on file    Gets together: Not on file    Attends religious service: Not on file    Active member of club or organization: Not on file    Attends meetings of clubs or organizations: Not on file    Relationship status: Not on file  . Intimate partner violence:    Fear of current or ex partner: Not on file    Emotionally abused: Not on file    Physically abused: Not on file    Forced sexual activity: Not on file  Other Topics Concern  . Not on file  Social History Narrative  . Not on file    No family history on file.  Past Medical History:  Diagnosis Date  . 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 Hypertension   . MRSA (methicillin resistant Staphylococcus aureus) 2010   right axilla  . Myasthenia gravis ([place])   . Pectoralis muscle rupture     Past Surgical History:  Procedure Laterality Date  . PECTORALIS TENDON REPAIR Right 08/12/2016   Procedure: RIGHT PECTORALIS TENDON REPAIR;  Surgeon: Marchia Bond, MD;  Location: [facility];  Service: Orthopedics;  Laterality: Right;  . THYMECTOMY  1998    Current Outpatient Medications  Medication Sig Dispense Refill  . aspirin-acetaminophen-caffeine (EXCEDRIN MIGRAINE) 250-250-65 MG tablet Take by mouth every 6 (six) hours as needed for headache.    . Mycophenolate Mofetil (CELLCEPT PO) Take 1,250 mg by mouth 2 (two) times daily.    . Pyridostigmine Bromide (MESTINON PO) Take 60 mg by mouth 2 (two) times daily.      No current facility-administered medications for this visit.      Allergies as of 10/18/2017  . (No Known Allergies)    Vitals: BP (!) 141/89   Pulse 62   Ht _0  (1.778 m)   Wt (!) 308 lb (139.7 kg)   BMI 44.19 kg/m  Last Weight:  Wt Readings from  Last 1 Encounters:  10/18/17 (!) 308 lb (139.7 kg)   VXB:LTJQ mass index is 44.19 kg/m.     Last Height:   Ht Readings from Last 1 Encounters:  10/18/17 _0  (1.778 m)    Physical exam:  General: The patient is awake, alert and appears not in acute distress. The patient is well groomed. Head: Normocephalic, atraumatic. Neck is supple. Mallampati 3-4 ,  neck circumference:19 " . Nasal airflow restricted ,Retrognathia is seen.  Cardiovascular:  Regular rate and rhythm , without  murmurs or carotid bruit, and without distended neck veins. Respiratory: Lungs are clear to auscultation. Skin:  Without evidence of edema, or rash. Trunk: BMI is 44.19. The patient's posture is erect.   Neurologic exam : The patient is awake and alert, oriented to place and time.   Speech is fluent,  without dysarthria, dysphonia or aphasia.  Mood and affect are appropriate.  Cranial nerves: Pupils are equal and briskly reactive to light. Funduscopic exam without evidence of pallor or edema. Extraocular movements  in vertical and horizontal planes intact and without nystagmus. Visual fields by finger perimetry are intact.Hearing to finger rub intact.  Facial sensation intact to fine touch.  Facial motor strength with ptosis on the left- and tongue and uvula move midline. Shoulder shrug was symmetrical.   Motor exam:  Normal tone, muscle bulk and symmetric strength in all extremities. Sensory:  Fine touch, pinprick and vibration were tested in all extremities. Proprioception tested in the upper extremities was normal. Coordination:Finger-to-nose maneuver  normal without evidence of ataxia, dysmetria or tremor. Gait and station: Patient walks without assistive device. Turns with 3 Steps. Deep tendon reflexes: in  the  upper and lower extremities are intact-     Assessment:  After physical and neurologic examination, review of laboratory studies,  Personal review of imaging studies, reports of other /same  Imaging studies, results of polysomnography and / or neurophysiology testing and pre-existing records as far as provided in visit., my assessment is   1) high risk for OSA in an overweight patient with Myasthenia gravis, and large neck and airway anatomy.   The morning headaches can be related to hypercapnia and  / or hypoxemia.  EDS - excessive daytime sleepiness can be related to OSA.    The patient was advised of the nature of the diagnosed disorder , the treatment options and the  risks for general health and wellness arising from not treating the condition.   I spent more than 45  minutes of face to face time with the patient.  Greater than 50% of time was spent in counseling and coordination of care. We have discussed the diagnosis and differential and I answered the patient's questions.    Plan:  Treatment plan and additional workup :  Attended sleep study with  CO2 monitoring, hypoxemia screening, heart rate - need to correlate  Apnea/ gas exchange problems that arise with  myasthenia. [facility] [place], MD 3/00/9233, 00:76 AM  Certified in Neurology by ABPN Certified in [place] by [facility] [address], [place], [place] [address]

## 2017-11-11 ENCOUNTER — Ambulatory Visit (INDEPENDENT_AMBULATORY_CARE_PROVIDER_SITE_OTHER): Payer: 59 | Admitting: Neurology

## 2017-11-11 DIAGNOSIS — G4710 Hypersomnia, unspecified: Secondary | ICD-10-CM

## 2017-11-11 DIAGNOSIS — Z6841 Body Mass Index (BMI) 40.0 and over, adult: Secondary | ICD-10-CM

## 2017-11-11 DIAGNOSIS — G4719 Other hypersomnia: Secondary | ICD-10-CM

## 2017-11-11 DIAGNOSIS — R5383 Other fatigue: Secondary | ICD-10-CM

## 2017-11-11 DIAGNOSIS — E661 Drug-induced obesity: Secondary | ICD-10-CM

## 2017-11-11 DIAGNOSIS — R739 Hyperglycemia, unspecified: Secondary | ICD-10-CM

## 2017-11-11 DIAGNOSIS — G7000 Myasthenia gravis without (acute) exacerbation: Secondary | ICD-10-CM

## 2017-11-11 DIAGNOSIS — G44019 Episodic cluster headache, not intractable: Secondary | ICD-10-CM

## 2017-11-11 DIAGNOSIS — T380X5A Adverse effect of glucocorticoids and synthetic analogues, initial encounter: Secondary | ICD-10-CM

## 2017-11-15 NOTE — Addendum Note (Signed)
Addended by: Larey Seat on: 11/15/2017 05:22 PM   Modules accepted: Orders

## 2017-11-15 NOTE — Procedures (Signed)
PATIENT'S NAME:  Jarae, Panas DOB:      20-Jul-1975      MR#:    [id number]     DATE OF RECORDING: 11/11/2017 REFERRING M.D.:  [name], FNP Study Performed:   Baseline Polysomnogram HISTORY:  Shirlean Schlein" A. Cichy is a 41 y.o. male patient with a diagnosis of myasthenia gravis for 21 years, followed by Dr. Nanine Means at [facility]. Mr. Ballentine had a thymectomy before he had seen me about 15 years ago for a sleep evaluation and at that time was not diagnosed with obstructive sleep apnea.  He now reports excessive daytime sleepiness (EDS) and waking with headaches for which he takes Excedrin- he is also still overweight, has steroid induced diabetes now, and hypertension.   Myasthenia gravis is well controlled. Multiple risk factors for the presence of sleep apnea and sleep apnea could be a contributing factor to these morning headaches and EDS. The patient endorsed the Epworth Sleepiness Scale at 16/24 points, FSS is at 42/63.  The patient's weight 308 pounds with a height of 70 (inches), resulting in a BMI of 44.2 kg/m2.The patient's neck circumference measured 19 inches.  CURRENT MEDICATIONS: Excedrin Migraine, Cellcept, Mestinon   PROCEDURE:  This is a multichannel digital polysomnogram utilizing the Somnostar 11.2 system.  Electrodes and sensors were applied and monitored per AASM Specifications.   EEG, EOG, Chin and Limb EMG, were sampled at 200 Hz.  ECG, Snore and Nasal Pressure, Thermal Airflow, Respiratory Effort, CPAP Flow and Pressure, Oximetry was sampled at 50 Hz. Digital video and audio were recorded.      BASELINE STUDY: Lights Out was at 21:52 and Lights On at 03:55.  Total recording time (TRT) was 363.5 minutes, with a total sleep time (TST) of 343.5 minutes.   The patient's sleep latency was 2 minutes.  REM latency was 70.5 minutes.  The sleep efficiency was 94.5 %.     SLEEP ARCHITECTURE: WASO (Wake after sleep onset) was 16 minutes.  There were 16.5 minutes in Stage N1, 208 minutes Stage N2,  48.5 minutes Stage N3 and 70.5 minutes in Stage REM.  The percentage of Stage N1 was 4.8%, Stage N2 was 60.6%, Stage N3 was 14.1% and Stage R (REM sleep) was 20.5%.  The arousals were noted as: 53 were spontaneous, 1 associated with PLMs, 15 were associated with respiratory events. RESPIRATORY ANALYSIS:  There were a total of 32 respiratory events:  11 obstructive apneas, 0 central apneas and 1 mixed apnea with 20 hypopneas. The patient also had 0 respiratory event related arousals (RERAs).     The total APNEA/HYPOPNEA INDEX (AHI) was 5.6 /hour and the total RESPIRATORY DISTURBANCE INDEX was 5.6 /hour. 12 events occurred in REM sleep and 26 events in NREM. The REM AHI was 0. 10.2 /hour, versus a non-REM AHI of 4.4. The patient spent 258 minutes of total sleep time in the supine position and 86 minutes in non-supine. The supine AHI was 6.7 versus a non-supine AHI of 2.1.  OXYGEN SATURATION & C02:  The Wake baseline 02 saturation was 94%, with the lowest being 85%. Time spent below 89% saturation equaled 8 minutes. Average End Tidal CO2 during sleep was 37.5 torr.  Total sleep time greater than 40 torr was 0.50 minutes, peak was 40 torr in REM and NREM sleep.   PERIODIC LIMB MOVEMENTS:   The patient had a total of 5 Periodic Limb Movements.  The Periodic Limb Movement (PLM) index was 0.9 and the PLM Arousal index was  0.2/hour.  Audio and video analysis did not show any abnormal or unusual movements, behaviors, phonations or vocalizations. No nocturia noted.   Snoring was noted. Normal sleep architecture noted. EKG was in keeping with normal sinus rhythm (NSR).  Post-study, the patient indicated that sleep was shorter and of lesser quality than usual.   IMPRESSION:  1. Mild Obstructive Sleep Apnea (OSA). 2. Mild Periodic Limb Movement Disorder (PLMD) 3. Mild to moderate Snoring. 4. No evidence of hypoxemia or hypercapnia of clinical significance.   RECOMMENDATIONS:  1. This PSG did not  indicate a reason for excessive daytime sleepiness or headaches. Mild OSA at AHI between 5-10/h can be treated with CPAP. This is optional. I would like Mr. Gertz to undergo a trial with auto-CPAP at 5-12 cm water pressure, heated humidity and a mask that he is comfortable with.  2. If he feels better, less sleepy and more alert, I recommend to continue CPAP therapy. If not , will d/c treatment.  I certify that I have reviewed the entire raw data recording prior to the issuance of this report in accordance with the Standards of Accreditation of the American Academy of Sleep Medicine (AASM)  Larey Seat, MD   11-15-2017  Diplomat, American Board of Psychiatry and Neurology  Diplomat, American Board of [place] Director, [facility] [place] Dr. Nanine Means, [facility].  [name], NP

## 2017-11-16 ENCOUNTER — Telehealth: Payer: Self-pay | Admitting: Neurology

## 2017-11-16 NOTE — Telephone Encounter (Signed)
-----   Message from Larey Seat, MD sent at 11/15/2017  5:22 PM EDT ----- This PSG did not indicate a reason for excessive daytime  sleepiness or headaches. Mild OSA at AHI between 5-10/h can be  treated with CPAP. This is optional. I would like Grant Hernandez to  undergo a trial with auto-CPAP at 5-12 cm water pressure, heated  humidity and a mask that he is comfortable with.  2. If he feels better, less sleepy and more alert, I recommend to  continue CPAP therapy. If not , will d/c treatment.  I certify that I have reviewed the entire raw data recording  prior to the issuance of this report in accordance with the  Standards of Accreditation of the [place] Academy of Sleep  Medicine (AASM)  Larey Seat, MD  11-15-2017

## 2017-11-16 NOTE — Telephone Encounter (Signed)
Pt returned call. I advised pt that Dr. Maureen Chatters reviewed their sleep study results and found that pt has mild sleep apnea. Dr. Brett Fairy recommends that pt starts auto cpap as a trial to see if it would help with daytime fatigue or headached. I reviewed PAP compliance expectations with the pt. Pt is agreeable to starting a CPAP. I advised pt that an order will be sent to a DME, [facility], and [facility] will call the pt within about one week after they file with the pt's insurance. [facility] will show the pt how to use the machine, fit for masks, and troubleshoot the CPAP if needed. A follow up appt was made for insurance purposes with Dr. Brett Fairy on Dec 10,2019 at 8:30 am. Pt verbalized understanding to arrive 15 minutes early and bring their CPAP. A letter with all of this information in it will be mailed to the pt as a reminder. I verified with the pt that the address we have on file is correct. Pt verbalized understanding of results. Pt had no questions at this time but was encouraged to call back if questions arise.

## 2017-11-16 NOTE — Telephone Encounter (Signed)
Called patient to discuss sleep study results. No answer at this time. LVM for the patient to call back.   

## 2017-11-16 NOTE — Telephone Encounter (Signed)
Called the patient again, no answer. LVM asking the patient to call back.

## 2017-11-16 NOTE — Telephone Encounter (Signed)
Pt returning RN's call.

## 2017-11-29 DIAGNOSIS — Z1212 Encounter for screening for malignant neoplasm of rectum: Secondary | ICD-10-CM

## 2017-11-29 DIAGNOSIS — R030 Elevated blood-pressure reading, without diagnosis of hypertension: Secondary | ICD-10-CM

## 2017-11-29 DIAGNOSIS — Z1211 Encounter for screening for malignant neoplasm of colon: Secondary | ICD-10-CM

## 2017-11-29 DIAGNOSIS — Z125 Encounter for screening for malignant neoplasm of prostate: Secondary | ICD-10-CM

## 2017-11-29 DIAGNOSIS — Z0000 Encounter for general adult medical examination without abnormal findings: Secondary | ICD-10-CM

## 2017-11-29 DIAGNOSIS — Z79899 Other long term (current) drug therapy: Secondary | ICD-10-CM | POA: Diagnosis not present

## 2018-02-13 ENCOUNTER — Telehealth: Payer: Self-pay | Admitting: Neurology

## 2018-02-13 NOTE — Telephone Encounter (Signed)
Called MR. Grant Hernandez cause he has an apt scheduled for 8:30 am on 02/14/18. I reached out to [facility] and doesn't look like the patient had started CPAP so wanted to see if he wanted to keep the apt or if he started CPAP with someone else so I can reach out to them and get a download. No answer. LVM advising the patient to call back.

## 2018-02-14 ENCOUNTER — Ambulatory Visit: Payer: Self-pay | Admitting: Neurology

## 2018-02-15 ENCOUNTER — Other Ambulatory Visit: Payer: Self-pay | Admitting: Neurology

## 2018-02-15 DIAGNOSIS — G44019 Episodic cluster headache, not intractable: Secondary | ICD-10-CM

## 2018-02-15 DIAGNOSIS — G4719 Other hypersomnia: Secondary | ICD-10-CM

## 2018-02-15 DIAGNOSIS — R5383 Other fatigue: Secondary | ICD-10-CM

## 2018-02-21 ENCOUNTER — Ambulatory Visit (INDEPENDENT_AMBULATORY_CARE_PROVIDER_SITE_OTHER): Payer: No Typology Code available for payment source | Admitting: Internal Medicine

## 2018-02-21 ENCOUNTER — Encounter: Payer: Self-pay | Admitting: Internal Medicine

## 2018-02-21 VITALS — BP 140/98 | HR 68 | Temp 98.1°F | Ht 70.0 in | Wt 307.4 lb

## 2018-02-21 DIAGNOSIS — I10 Essential (primary) hypertension: Secondary | ICD-10-CM | POA: Diagnosis not present

## 2018-02-21 DIAGNOSIS — R635 Abnormal weight gain: Secondary | ICD-10-CM | POA: Diagnosis not present

## 2018-02-21 DIAGNOSIS — R030 Elevated blood-pressure reading, without diagnosis of hypertension: Secondary | ICD-10-CM

## 2018-02-21 DIAGNOSIS — G4733 Obstructive sleep apnea (adult) (pediatric): Secondary | ICD-10-CM | POA: Diagnosis not present

## 2018-02-21 DIAGNOSIS — Z6841 Body Mass Index (BMI) 40.0 and over, adult: Secondary | ICD-10-CM

## 2018-02-21 MED ORDER — LISINOPRIL 2.5 MG PO TABS: 2.5000 mg | ORAL_TABLET | Freq: Every day | ORAL | 0 refills | Status: DC

## 2018-02-21 NOTE — Patient Instructions (Signed)

## 2018-02-21 NOTE — Progress Notes (Signed)
Subjective:     Patient ID: [facility] , male    DOB: [birth year] , 42 y.o.   MRN: [id number]   Chief Complaint  Patient presents with  . Hypertension    HPI  Pt is here for elevated BP FU, and at BP 130/75-80. No fam hx on mother with HTN. Does not know about father's health. Having trouble with making good choices since he works a lot and ends up eating out a lot.   Exercises 5-6h/ week mostly walking.   Past Medical History:  Diagnosis Date  . 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 Hypertension   . MRSA (methicillin resistant Staphylococcus aureus) 2010   right axilla  . Myasthenia gravis ([place])   . Pectoralis muscle rupture      History reviewed. No pertinent family history.   Current Outpatient Medications:  .  aspirin-acetaminophen-caffeine (EXCEDRIN MIGRAINE) 250-250-65 MG tablet, Take by mouth every 6 (six) hours as needed for headache., Disp: , Rfl:  .  mycophenolate (CELLCEPT) 250 MG capsule, Take by mouth., Disp: , Rfl:  .  mycophenolate (CELLCEPT) 500 MG tablet, Take by mouth., Disp: , Rfl:  .  Pyridostigmine Bromide (MESTINON PO), Take 60 mg by mouth 2 (two) times daily. , Disp: , Rfl:    No Known Allergies   Review of Systems  Constitutional: Negative for chills, diaphoresis, fatigue and fever.  HENT: Negative for tinnitus.   Eyes: Negative for visual disturbance.  Respiratory: Negative for chest tightness and shortness of breath.   Cardiovascular: Negative for chest pain, palpitations and leg swelling.  Gastrointestinal: Negative for constipation and diarrhea.  Genitourinary: Negative for dysuria and frequency.  Neurological: Negative for dizziness, weakness and headaches.       Gets then on occasion, got a bad one when he go to work and took excedrin which helped decrease the pain, but did not resolve. Felt like his typical migraines.   Does snore. Had sleep study in  November which shoed mild sleep apnea and wants to try him on C pap.      Today's Vitals   02/21/18 0907  BP: (!) 140/98  Pulse: 68  Temp: 98.1 F (36.7 C)  TempSrc: Oral  SpO2: 97%  Weight: (!) 307 lb 6.4 oz (139.4 kg)  Height: 5\' 10"  (1.778 m)   Body mass index is 44.11 kg/m.   Objective:  Physical Exam  Constitutional: She is oriented to person, place, and time. She appears well-developed and well-nourished. No distress.  HENT:  Head: Normocephalic and atraumatic.  Right Ear: External ear normal.  Left Ear: External ear normal.  Nose: Nose normal.  Eyes: Conjunctivae are normal. Right eye exhibits no discharge. Left eye exhibits no discharge. No scleral icterus.  Neck: Neck supple. No thyromegaly present.  No carotid bruits bilaterally  Cardiovascular: Normal rate and regular rhythm.  No murmur heard. Pulmonary/Chest: Effort normal and breath sounds normal. No respiratory distress.  Musculoskeletal: Normal range of motion. She exhibits no edema.  Lymphadenopathy:    She has no cervical adenopathy.  Neurological: She is alert and oriented to person, place, and time.  Skin: Skin is warm and dry. Capillary refill takes less than 2 seconds. No rash noted. She is not diaphoretic.  Psychiatric: She has a normal mood and affect. Her behavior is normal. Judgment and thought content normal.  Nursing note reviewed.  Assessment And Plan:     1.  HTN     Was placed on Lisinopril 2.5 mg qd. Advised to do BP diaries and bring them next month. If systolic stays in the 426'S, needs to call me sooner.  - Lipid Profile - CMP14 + Anion Gap - CBC no Diff - lisinopril (ZESTRIL) 2.5 MG tablet; Take 1 tablet (2.5 mg total) by mouth daily.  Dispense: 30 tablet; Refill: 0  2. [facility]- [facility]  - TSH - T4, Free - T3, free - Amb ref to Medical Nutrition Therapy-MNT  3. Class 3 severe obesity with body mass index (BMI) of 40.0 to 44.9 in adult,  unspecified obesity type, unspecified whether serious comorbidity present ([place]) - Amb ref to [facility] Nutrition Therapy-[facility]. 4- Mild sleep apnea- will await on C-pap machine FU 1 month and advised to bring BP diaries. Shasta Chinn RODRIGUEZ-SOUTHWORTH, PA-C

## 2018-02-22 LAB — CMP14 + ANION GAP
A/G RATIO: 1.4 (ref 1.2–2.2)
ALBUMIN: 4.3 g/dL (ref 3.5–5.5)
ALT: 27 IU/L (ref 0–44)
AST: 24 IU/L (ref 0–40)
Alkaline Phosphatase: 65 IU/L (ref 39–117)
Anion Gap: 15 mmol/L (ref 10.0–18.0)
BUN / CREAT RATIO: 13 (ref 9–20)
BUN: 14 mg/dL (ref 6–24)
Bilirubin Total: 0.4 mg/dL (ref 0.0–1.2)
CALCIUM: 9.7 mg/dL (ref 8.7–10.2)
CO2: 24 mmol/L (ref 20–29)
Chloride: 103 mmol/L (ref 96–106)
Creatinine, Ser: 1.12 mg/dL (ref 0.76–1.27)
GFR calc Af Amer: 92 mL/min/{1.73_m2} (ref >59)
GFR calc non Af Amer: 79 mL/min/{1.73_m2} (ref >59)
GLOBULIN, TOTAL: 3.1 g/dL (ref 1.5–4.5)
Glucose: 89 mg/dL (ref 65–99)
POTASSIUM: 4.7 mmol/L (ref 3.5–5.2)
SODIUM: 142 mmol/L (ref 134–144)
Total Protein: 7.4 g/dL (ref 6.0–8.5)

## 2018-02-22 LAB — CBC
Hematocrit: 42.1 % (ref 37.5–51.0)
Hemoglobin: 13.5 g/dL (ref 13.0–17.7)
MCH: 26.9 pg (ref 26.6–33.0)
MCHC: 32.1 g/dL (ref 31.5–35.7)
MCV: 84 fL (ref 79–97)
PLATELETS: 242 10*3/uL (ref 150–450)
RBC: 5.01 x10E6/uL (ref 4.14–5.80)
RDW: 13.4 % (ref 12.3–15.4)
WBC: 4.9 10*3/uL (ref 3.4–10.8)

## 2018-02-22 LAB — LIPID PANEL
CHOL/HDL RATIO: 3.5 ratio (ref 0.0–5.0)
CHOLESTEROL TOTAL: 156 mg/dL (ref 100–199)
HDL: 44 mg/dL (ref >39)
LDL CALC: 100 mg/dL — AB (ref 0–99)
TRIGLYCERIDES: 62 mg/dL (ref 0–149)
VLDL Cholesterol Cal: 12 mg/dL (ref 5–40)

## 2018-02-22 LAB — T3, FREE: T3, Free: 3.6 pg/mL (ref 2.0–4.4)

## 2018-02-22 LAB — T4, FREE: Free T4: 1.01 ng/dL (ref 0.82–1.77)

## 2018-02-22 LAB — TSH: TSH: 2.01 u[IU]/mL (ref 0.450–4.500)

## 2018-03-30 ENCOUNTER — Other Ambulatory Visit: Payer: Self-pay

## 2018-03-30 ENCOUNTER — Ambulatory Visit (INDEPENDENT_AMBULATORY_CARE_PROVIDER_SITE_OTHER): Payer: No Typology Code available for payment source | Admitting: Internal Medicine

## 2018-03-30 ENCOUNTER — Encounter: Payer: Self-pay | Admitting: Internal Medicine

## 2018-03-30 VITALS — BP 136/82 | HR 81 | Temp 98.5°F | Resp 16 | Ht 70.0 in | Wt 307.0 lb

## 2018-03-30 DIAGNOSIS — R030 Elevated blood-pressure reading, without diagnosis of hypertension: Secondary | ICD-10-CM

## 2018-03-30 DIAGNOSIS — Z6841 Body Mass Index (BMI) 40.0 and over, adult: Secondary | ICD-10-CM

## 2018-03-30 DIAGNOSIS — I10 Essential (primary) hypertension: Secondary | ICD-10-CM | POA: Diagnosis not present

## 2018-03-30 DIAGNOSIS — G4733 Obstructive sleep apnea (adult) (pediatric): Secondary | ICD-10-CM | POA: Diagnosis not present

## 2018-03-30 MED ORDER — LISINOPRIL 2.5 MG PO TABS: 2.5000 mg | ORAL_TABLET | Freq: Every day | ORAL | 0 refills | Status: DC

## 2018-03-30 NOTE — Progress Notes (Signed)
Subjective:     Patient ID: Grant [facility] , male    DOB: 09/25/1975 , 43 y.o.   MRN: [id number]   Chief Complaint  Patient presents with  . Hypertension    follow-up    HPI He is here for HTN FU, BP have been in the 130's over 80's. Has not had time to exercise since he works 2 jobs 7 days a week.  He is on a Higher education careers adviser at [employer].  He has been tying a C-pap and is using it at least 5 h at bed time and will be returning it next week.   Past Medical History:  Diagnosis Date  . 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 Hypertension   . MRSA (methicillin resistant Staphylococcus aureus) 2010   right axilla  . Myasthenia gravis ([place])   . Pectoralis muscle rupture      Family History  Problem Relation Age of Onset  . Healthy Mother      Current Outpatient Medications:  .  aspirin-acetaminophen-caffeine (EXCEDRIN MIGRAINE) 250-250-65 MG tablet, Take by mouth every 6 (six) hours as needed for headache., Disp: , Rfl:  .  lisinopril (ZESTRIL) 2.5 MG tablet, Take 1 tablet (2.5 mg total) by mouth daily., Disp: 90 tablet, Rfl: 0 .  mycophenolate (CELLCEPT) 250 MG capsule, Take by mouth., Disp: , Rfl:  .  mycophenolate (CELLCEPT) 500 MG tablet, Take by mouth., Disp: , Rfl:  .  Pyridostigmine Bromide (MESTINON PO), Take 60 mg by mouth 2 (two) times daily. , Disp: , Rfl:    No Known Allergies   Review of Systems  Constitutional: Positive for fatigue. Negative for chills and diaphoresis.  HENT: Negative.   Eyes: Negative for visual disturbance.  Respiratory: Negative for cough and shortness of breath.   Cardiovascular: Negative for chest pain, palpitations and leg swelling.  Gastrointestinal: Negative for nausea.  Genitourinary: Negative for difficulty urinating and frequency.  Musculoskeletal: Negative for gait problem.  Skin: Negative for rash.  Neurological: Negative for  dizziness, light-headedness and headaches.  Psychiatric/Behavioral: Negative for sleep disturbance.     Today's Vitals   03/30/18 1548  BP: 136/82  Pulse: 81  Resp: 16  Temp: 98.5 F (36.9 C)  TempSrc: Oral  SpO2: 96%  Weight: (!) 307 lb (139.3 kg)  Height: 5\' 10"  (1.778 m)   Body mass index is 44.05 kg/m.   Objective:  Physical Exam   Constitutional: She is oriented to person, place, and time. She appears well-developed and well-nourished. No distress.  HENT:  Head: Normocephalic and atraumatic.  Right Ear: External ear normal.  Left Ear: External ear normal.  Nose: Nose normal.  Eyes: Conjunctivae are normal. Right eye exhibits no discharge. Left eye exhibits no discharge. No scleral icterus.  Neck: Neck supple. No thyromegaly present.  No carotid bruits bilaterally  Cardiovascular: Normal rate and regular rhythm.  No murmur heard. Pulmonary/Chest: Effort normal and breath sounds normal. No respiratory distress.  Musculoskeletal: Normal range of motion.  Lymphadenopathy: She has no cervical adenopathy.  Neurological: She is alert and oriented to person, place, and time.  Skin: Skin is warm and dry. Capillary refill takes less than 2 seconds. No rash noted. She is not diaphoretic.  Psychiatric: She has a normal mood and affect. Her behavior is normal. Judgment and thought content normal.  Nursing note reviewed.  Assessment And Plan:   1. Class 3 severe  obesity due to excess calories with body mass index (BMI) of 40.0 to 44.9 in adult, unspecified whether serious comorbidity present ([place])- unchanged.  I discussed with pt using the Myfitnesspal to log his calories and set calories for weight loss. I taught him how to use it.  I also suggested 10 min of exercise three times a day or get a stationary bike and when he gets home instead of siting at the table using his phone, to get on the stationary bike and do it while on it.   2. Obstructive sleep apnea syndrome- in  process of C-pap trial  3. essential hypertension- improved. Will continue same meds and needs to continue monitoring this.  - lisinopril (ZESTRIL) 2.5 MG tablet; Take 1 tablet (2.5 mg total) by mouth daily.  Dispense: 90 tablet; Refill: 0  FU in 3 months.   Cyanna Neace RODRIGUEZ-SOUTHWORTH, PA-C

## 2018-03-30 NOTE — Patient Instructions (Signed)
° ° ° °  If you have lab work done today you will be contacted with your lab results within the next 2 weeks.  If you have not heard from us then please contact us. The fastest way to get your results is to register for My Chart. ° ° °IF you received an x-ray today, you will receive an invoice from [facility]. Please contact [facility] at [phone] with questions or concerns regarding your invoice.  ° °IF you received labwork today, you will receive an invoice from [facility]. Please contact [facility] at [phone] with questions or concerns regarding your invoice.  ° °Our billing staff will not be able to assist you with questions regarding bills from these companies. ° °You will be contacted with the lab results as soon as they are available. The fastest way to get your results is to activate your My Chart account. Instructions are located on the last page of this paperwork. If you have not heard from us regarding the results in 2 weeks, please contact this office. °  ° ° ° °

## 2018-04-12 ENCOUNTER — Encounter: Payer: Self-pay | Admitting: Family Medicine

## 2018-04-27 ENCOUNTER — Other Ambulatory Visit: Payer: Self-pay | Admitting: Internal Medicine

## 2018-04-27 MED ORDER — NEBIVOLOL HCL 2.5 MG PO TABS: 2.5000 mg | ORAL_TABLET | Freq: Every day | ORAL | 0 refills | Status: DC

## 2018-04-27 NOTE — Progress Notes (Unsigned)
It has been done.

## 2018-05-08 NOTE — Progress Notes (Addendum)
PATIENT: Grant Hernandez DOB: 12-30-1975  REASON FOR VISIT: follow up HISTORY FROM: patient  Chief Complaint  Patient presents with  . Follow-up    Initial cpap follow up. Alone. Rm Patient mentioned that their has been alot of confusion when it comes to his cpap machine. Patient mentioned for the 30 day trial he didnt notice a difference.      HISTORY OF PRESENT ILLNESS: Today 05/11/18 Grant Hernandez is a 43 y.o. male here today for follow up for mild sleep apnea on CPAP.   He reports using loaner machine from a very care for about 30 days.  There was a period of time for about 5 days that he was unable to use machine due to some irritation from nasal mask.  Otherwise he reports being compliant.  No download report available today.   He is uncertain if CPAP is beneficial to him.  He has not noticed any changes in how he feels.  He does have myasthenia gravis and is treated by [facility].  He has an upcoming appointment next week.  He is uncertain if fatigue is related to MG.  Unfortunately his insurance will not her CPAP machine or supplies.  He is having to pay out of pocket should he decide to continue to use.  He is uncertain at this time whether he wishes to continue CPAP therapy.  HISTORY: (copied from Grant Hernandez's note on 10/18/2017) Grant Hernandez is a 43 y.o. male patient , seen here as in a referral from [facility] for a new evaluation of sleep apnea.   The patient carried the diagnosis of myasthenia gravis for 21 years, followed by [facility] at [facility].  Mr. Min had seen me about 10 - 15 years ago for a sleep evaluation and at that time was not diagnosed with obstructive sleep apnea.  However he now wakes up with headaches which was not the case in the past, he also still is overweight, has diabetes now and hypertension.  While his myasthenia gravis is well controlled there are multiple risk factors for the presence of sleep apnea and sleep apnea could be a contributing factor to  these morning headaches.  Sleep habits are as follows:  Dinner time is around 7 PM and he watches TV before he goes to bed, he retreats at 9 Pm to watch TV in the bedroom and is asleep by 10.00. His wife insists on the TV. Wife Grant Hernandez works at [facility].  [name] reports that the bedroom was never really quiet there is either the TV or his wife snoring, but it is cool and usually dark after the TV goes off.  Once he is asleep he can stay asleep for 4 to 5 hours, he may wake up once in the middle of the night but usually can go back to sleep.  His wife has to rise at 4:30 in the morning which usually wakes him up, too.  The alarm is set for 4:30 AM.  The patient prefers to sleep on his left side, sometimes on his back sometimes prone, he cannot tolerate sleep on the right side since he had a shoulder injury and surgery.  He sleeps on 2 pillows.  The bed is not adjustable.  The patient does not report any nocturia, he does not wake up usually from pain shortness of breath, but has snored himself awake when sleeping reclined on the couch.  Averages 6 hours of sleep at night. Headaches  wake him (these are pounding but sharp and stabbing, may sometimes feel as if he is stepped through the eye) and he wakes with headaches that are more slight, more dull sometimes throbbing and more affecting the temple.  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  He rarely takes naps ( 30 minute)  because he wakes with a headache.   Sleep medical history and family sleep history:  MG, DM after steroid treatment - HTN, obesity .  Social history: Wife Grant Hernandez works at [employer] in [place], I met her when she was pregnant with their first child. 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   REVIEW OF SYSTEMS: Out of a complete 14 system review of symptoms, the patient complains only of the  following symptoms, fatigue, daytime sleepiness and all other reviewed systems are negative.  No flowsheet data found.  How likely are you to doze in the following situations: 0 = not likely, 1 = slight chance, 2 = moderate chance, 3 = high chance  Sitting and Reading? Watching Television? Sitting inactive in a public place (theater or meeting)? Lying down in the afternoon when circumstances permit? Sitting and talking to someone? Sitting quietly after lunch without alcohol? In a car, while stopped for a few minutes in traffic? As a passenger in a car for an hour without a break?  Total Epworth sleepiness scale: 14/ 24  Fatigue severity scale: 17/63 points   ALLERGIES: No Known Allergies  HOME MEDICATIONS: Outpatient Medications Prior to Visit  Medication Sig Dispense Refill  . aspirin-acetaminophen-caffeine (EXCEDRIN MIGRAINE) 250-250-65 MG tablet Take by mouth every 6 (six) hours as needed for headache.    . mycophenolate (CELLCEPT) 250 MG capsule Take by mouth.    . mycophenolate (CELLCEPT) 500 MG tablet Take by mouth.    . nebivolol (BYSTOLIC) 2.5 MG tablet Take 1 tablet (2.5 mg total) by mouth daily. 30 tablet 0  . Pyridostigmine Bromide (MESTINON PO) Take 60 mg by mouth 2 (two) times daily.      No facility-administered medications prior to visit.     PAST MEDICAL HISTORY: Past Medical History:  Diagnosis Date  . 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 Hypertension   . MRSA (methicillin resistant Staphylococcus aureus) 2010   right axilla  . Myasthenia gravis ([place])   . Pectoralis muscle rupture     PAST SURGICAL HISTORY: Past Surgical History:  Procedure Laterality Date  . PECTORALIS TENDON REPAIR Right 08/12/2016   Procedure: RIGHT PECTORALIS TENDON REPAIR;  Surgeon: Marchia Bond, MD;  Location: [facility];  Service: Orthopedics;  Laterality: Right;  . THYMECTOMY   1998    FAMILY HISTORY: Family History  Problem Relation Age of Onset  . Healthy Mother     SOCIAL HISTORY: Social History   Socioeconomic History  . Marital status: Married    Spouse name: Not on file  . Number of children: Not on file  . Years of education: Not on file  . Highest education level: Not on file  Occupational History  . Not on file  Social Needs  . Financial resource strain: Not on file  . Food insecurity:    Worry: Not on file    Inability: Not on file  . Transportation needs:  Medical: Not on file    Non-medical: Not on file  Tobacco Use  . Smoking status: Never Smoker  . Smokeless tobacco: Never Used  Substance and Sexual Activity  . Alcohol use: Yes    Comment: social  . Drug use: No  . Sexual activity: Yes    Birth control/protection: Condom  Lifestyle  . Physical activity:    Days per week: Not on file    Minutes per session: Not on file  . Stress: Not on file  Relationships  . Social connections:    Talks on phone: Not on file    Gets together: Not on file    Attends religious service: Not on file    Active member of club or organization: Not on file    Attends meetings of clubs or organizations: Not on file    Relationship status: Not on file  . Intimate partner violence:    Fear of current or ex partner: Not on file    Emotionally abused: Not on file    Physically abused: Not on file    Forced sexual activity: Not on file  Other Topics Concern  . Not on file  Social History Narrative  . Not on file      PHYSICAL EXAM  Vitals:   05/11/18 0811  BP: 129/79  Pulse: 70, regular   Weight: 299 lb (135.6 kg)  Height: 5' 10" (1.778 m)    Respiratory rate at rest:   /min.   Breath holding capacity for ** seconds.   Body mass index is 42.9 kg/m.  Generalized: Well developed, in no acute distress  Cardiology: normal rate and rhythm, no murmur noted Respiratory: Lungs clear to auscultation bilaterally. Neck circumference  18.5 inches, Mallampati 3+ Neurological examination  Mentation: Alert oriented to time, place, history taking. Follows all commands speech and language fluent Cranial nerve II-XII: Pupils were equal in size and round, reactive to light. Extraocular movements were full, visual field were full on confrontational test. Facial sensation and strength were normal. Uvula and  Tongue move in midline. Head turning and shoulder shrug were symmetric. Motor: Full strength and symmetric motor tone is noted throughout.  Sensory: intact to soft touch. No evidence of extinction is noted.  Coordination: Finger-nose bilaterally intact- no tremor, pronatordrift, no ataxia or dysmetria.   Gait and station: Gait is intact, the patient can rise from a seated position without bracing himself. Stance is normal based. Toe and Heel walk deferred.  Romberg deferred. Tandem gait deferred.    DIAGNOSTIC DATA (LABS, IMAGING, TESTING) - I reviewed patient records, labs, notes, testing and imaging myself where available.  [facility] notes, [facility].   Lab Results  Component Value Date   WBC 4.9 02/21/2018   HGB 13.5 02/21/2018   HCT 42.1 02/21/2018   MCV 84 02/21/2018   PLT 242 02/21/2018      Component Value Date/Time   NA 142 02/21/2018 1010   K 4.7 02/21/2018 1010   CL 103 02/21/2018 1010   CO2 24 02/21/2018 1010   GLUCOSE 89 02/21/2018 1010   GLUCOSE 103 (H) 07/27/2007 2145   BUN 14 02/21/2018 1010   CREATININE 1.12 02/21/2018 1010   CALCIUM 9.7 02/21/2018 1010   PROT 7.4 02/21/2018 1010   ALBUMIN 4.3 02/21/2018 1010   AST 24 02/21/2018 1010   ALT 27 02/21/2018 1010   ALKPHOS 65 02/21/2018 1010   BILITOT 0.4 02/21/2018 1010   GFRNONAA 79 02/21/2018 1010   GFRAA 92 02/21/2018 1010  Lab Results  Component Value Date   CHOL 156 02/21/2018   HDL 44 02/21/2018   LDLCALC 100 (H) 02/21/2018   TRIG 62 02/21/2018   CHOLHDL 3.5 02/21/2018   No results found for: HGBA1C No results found for: VITAMINB12 Lab  Results  Component Value Date   TSH 2.010 02/21/2018       ASSESSMENT AND PLAN 43 y.o. year old male  has a past medical history of Complication of anesthesia, Hypertension, MRSA (methicillin resistant Staphylococcus aureus) (2010), Myasthenia gravis ([place]), and Pectoralis muscle rupture. here with     ICD-10-CM   1. Obstructive sleep apnea syndrome G47.33  non compliance with CPAP.   2. Myasthenia gravis ([place]) G70.00      Unfortunately there is no compliance report available to review during today's visit. We have requested report from [facility]. We have reviewed causes of sleep apnea as well as discussed risk factors of untreated sleep apnea.  He is uncertain whether he wishes to continue therapy at this time.  Unfortunately there will be a significant financial burden for him to continue therapy.  I have advised that he reach out to [facility] for potential assistance with obtaining a refurbished unit for a payment arrangement.  He is aware that ends treated sleep apnea could lead to comorbidities such as hypertension, migraines, stroke and heart attack.  He verbalizes understanding.  He has upcoming appointment with neurology at [facility] for management of myasthenia gravis.  He will follow-up with [facility] as needed pending his decision to continue therapy.   Addendum 05/15/2018 Patient compliance report dated 03/03/2018 through 04/12/2018 reveals that patient used CPAP 15 out of 41 days for compliance of 37%.  3 out of the 41 days he used CPAP greater than 4 hours for compliance of 7%.  AHI was 0.2 on 5 to 12 cm of water and EPR of 3.  There was no significant leak.  This report shows suboptimal compliance.  Patient will be notified of details of report.   I spent 15 minutes with the patient. 50% of this time was spent counseling and educating patient on plan of care and medications.    Debbora Presto, FNP-C 05/11/2018, 9:49 AM [facility] [address], [place],  [place] [address] [phone]   Addendum: the patient does not fulfill criteria of CPAP therapy compliance, such as 4 hours or more of nightly use, and therfore will not be covered for supplies by insurance. Larey Seat, MD

## 2018-05-11 ENCOUNTER — Ambulatory Visit: Payer: Self-pay | Admitting: Neurology

## 2018-05-11 ENCOUNTER — Encounter: Payer: Self-pay | Admitting: Family Medicine

## 2018-05-11 ENCOUNTER — Ambulatory Visit: Payer: No Typology Code available for payment source | Admitting: Family Medicine

## 2018-05-11 VITALS — BP 129/79 | HR 70 | Ht 70.0 in | Wt 299.0 lb

## 2018-05-11 DIAGNOSIS — G7000 Myasthenia gravis without (acute) exacerbation: Secondary | ICD-10-CM | POA: Diagnosis not present

## 2018-05-11 DIAGNOSIS — G4733 Obstructive sleep apnea (adult) (pediatric): Secondary | ICD-10-CM | POA: Diagnosis not present

## 2018-05-11 NOTE — Patient Instructions (Addendum)
Call [facility] to discuss options for CPAP payment  Call office or send mychart message if you change your mind about treatment    CPAP and BPAP Information CPAP and BPAP are methods of helping a person breathe with the use of air pressure. CPAP stands for "continuous positive airway pressure." BPAP stands for "bi-level positive airway pressure." In both methods, air is blown through your nose or mouth and into your air passages to help you breathe well. CPAP and BPAP use different amounts of pressure to blow air. With CPAP, the amount of pressure stays the same while you breathe in and out. With BPAP, the amount of pressure is increased when you breathe in (inhale) so that you can take larger breaths. Your health care provider will recommend whether CPAP or BPAP would be more helpful for you. Why are CPAP and BPAP treatments used? CPAP or BPAP can be helpful if you have:  Sleep apnea.  Chronic obstructive pulmonary disease (COPD).  Heart failure.  Medical conditions that weaken the muscles of the chest including muscular dystrophy, or neurological diseases such as amyotrophic lateral sclerosis (ALS).  Other problems that cause breathing to be weak, abnormal, or difficult. CPAP is most commonly used for obstructive sleep apnea (OSA) to keep the airways from collapsing when the muscles relax during sleep. How is CPAP or BPAP administered? Both CPAP and BPAP are provided by a small machine with a flexible plastic tube that attaches to a plastic mask. You wear the mask. Air is blown through the mask into your nose or mouth. The amount of pressure that is used to blow the air can be adjusted on the machine. Your health care provider will determine the pressure setting that should be used based on your individual needs. When should CPAP or BPAP be used? In most cases, the mask only needs to be worn during sleep. Generally, the mask needs to be worn throughout the night and during any daytime  naps. People with certain medical conditions may also need to wear the mask at other times when they are awake. Follow instructions from your health care provider about when to use the machine. What are some tips for using the mask?   Because the mask needs to be snug, some people feel trapped or closed-in (claustrophobic) when first using the mask. If you feel this way, you may need to get used to the mask. One way to do this is by holding the mask loosely over your nose or mouth and then gradually applying the mask more snugly. You can also gradually increase the amount of time that you use the mask.  Masks are available in various types and sizes. Some fit over your mouth and nose while others fit over just your nose. If your mask does not fit well, talk with your health care provider about getting a different one.  If you are using a mask that fits over your nose and you tend to breathe through your mouth, a chin strap may be applied to help keep your mouth closed.  The CPAP and BPAP machines have alarms that may sound if the mask comes off or develops a leak.  If you have trouble with the mask, it is very important that you talk with your health care provider about finding a way to make the mask easier to tolerate. Do not stop using the mask. Stopping the use of the mask could have a negative impact on your health. What are some tips for  using the machine?  Place your CPAP or BPAP machine on a secure table or stand near an electrical outlet.  Know where the on/off switch is located on the machine.  Follow instructions from your health care provider about how to set the pressure on your machine and when you should use it.  Do not eat or drink while the CPAP or BPAP machine is on. Food or fluids could get pushed into your lungs by the pressure of the CPAP or BPAP.  Do not smoke. Tobacco smoke residue can damage the machine.  For home use, CPAP and BPAP machines can be rented or purchased  through home health care companies. Many different brands of machines are available. Renting a machine before purchasing may help you find out which particular machine works well for you.  Keep the CPAP or BPAP machine and attachments clean. Ask your health care provider for specific instructions. Get help right away if:  You have redness or open areas around your nose or mouth where the mask fits.  You have trouble using the CPAP or BPAP machine.  You cannot tolerate wearing the CPAP or BPAP mask.  You have pain, discomfort, and bloating in your abdomen. Summary  CPAP and BPAP are methods of helping a person breathe with the use of air pressure.  Both CPAP and BPAP are provided by a small machine with a flexible plastic tube that attaches to a plastic mask.  If you have trouble with the mask, it is very important that you talk with your health care provider about finding a way to make the mask easier to tolerate. This information is not intended to replace advice given to you by your health care provider. Make sure you discuss any questions you have with your health care provider. Document Released: 11/21/2003 Document Revised: 10/25/2017 Document Reviewed: 01/12/2016 Elsevier Interactive Patient Education  2019 [employer]vier Inc.    Sleep Apnea Sleep apnea affects breathing during sleep. It causes breathing to stop for a short time or to become shallow. It can also increase the risk of:  Heart attack.  Stroke.  Being very overweight (obese).  Diabetes.  Heart failure.  Irregular heartbeat. The goal of treatment is to help you breathe normally again. What are the causes? There are three kinds of sleep apnea:  Obstructive sleep apnea. This is caused by a blocked or collapsed airway.  Central sleep apnea. This happens when the brain does not send the right signals to the muscles that control breathing.  Mixed sleep apnea. This is a combination of obstructive and central  sleep apnea. The most common cause of this condition is a collapsed or blocked airway. This can happen if:  Your throat muscles are too relaxed.  Your tongue and tonsils are too large.  You are overweight.  Your airway is too small. What increases the risk?  Being overweight.  Smoking.  Having a small airway.  Being older.  Being male.  Drinking alcohol.  Taking medicines to calm yourself (sedatives or tranquilizers).  Having family members with the condition. What are the signs or symptoms?  Trouble staying asleep.  Being sleepy or tired during the day.  Getting angry a lot.  Loud snoring.  Headaches in the morning.  Not being able to focus your mind (concentrate).  Forgetting things.  Less interest in sex.  Mood swings.  Personality changes.  Feelings of sadness (depression).  Waking up a lot during the night to pee (urinate).  Dry mouth.  Sore throat. How is this diagnosed?  Your medical history.  A physical exam.  A test that is done when you are sleeping (sleep study). The test is most often done in a sleep lab but may also be done at home. How is this treated?   Sleeping on your side.  Using a medicine to get rid of mucus in your nose (decongestant).  Avoiding the use of alcohol, medicines to help you relax, or certain pain medicines (narcotics).  Losing weight, if needed.  Changing your diet.  Not smoking.  Using a machine to open your airway while you sleep, such as: ? An oral appliance. This is a mouthpiece that shifts your lower jaw forward. ? A CPAP device. This device blows air through a mask when you breathe out (exhale). ? An EPAP device. This has valves that you put in each nostril. ? A BPAP device. This device blows air through a mask when you breathe in (inhale) and breathe out.  Having surgery if other treatments do not work. It is important to get treatment for sleep apnea. Without treatment, it can lead  to:  High blood pressure.  Coronary artery disease.  In men, not being able to have an erection (impotence).  Reduced thinking ability. Follow these instructions at home: Lifestyle  Make changes that your doctor recommends.  Eat a healthy diet.  Lose weight if needed.  Avoid alcohol, medicines to help you relax, and some pain medicines.  Do not use any products that contain nicotine or tobacco, such as cigarettes, e-cigarettes, and chewing tobacco. If you need help quitting, ask your doctor. General instructions  Take over-the-counter and prescription medicines only as told by your doctor.  If you were given a machine to use while you sleep, use it only as told by your doctor.  If you are having surgery, make sure to tell your doctor you have sleep apnea. You may need to bring your device with you.  Keep all follow-up visits as told by your doctor. This is important. Contact a doctor if:  The machine that you were given to use during sleep bothers you or does not seem to be working.  You do not get better.  You get worse. Get help right away if:  Your chest hurts.  You have trouble breathing in enough air.  You have an uncomfortable feeling in your back, arms, or stomach.  You have trouble talking.  One side of your body feels weak.  A part of your face is hanging down. These symptoms may be an emergency. Do not wait to see if the symptoms will go away. Get medical help right away. Call your local emergency services (911 in the [place] Do not drive yourself to the hospital. Summary  This condition affects breathing during sleep.  The most common cause is a collapsed or blocked airway.  The goal of treatment is to help you breathe normally while you sleep. This information is not intended to replace advice given to you by your health care provider. Make sure you discuss any questions you have with your health care provider. Document Released: 12/02/2007  Document Revised: 10/18/2017 Document Reviewed: 10/18/2017 Elsevier Interactive Patient Education  [employer].

## 2018-05-12 ENCOUNTER — Encounter: Payer: Self-pay | Admitting: Family Medicine

## 2018-06-28 ENCOUNTER — Ambulatory Visit: Payer: No Typology Code available for payment source | Admitting: Nurse Practitioner

## 2018-06-28 ENCOUNTER — Ambulatory Visit: Payer: No Typology Code available for payment source | Admitting: Internal Medicine

## 2018-06-29 ENCOUNTER — Encounter: Payer: Self-pay | Admitting: Internal Medicine

## 2018-06-29 ENCOUNTER — Other Ambulatory Visit: Payer: Self-pay

## 2018-06-29 ENCOUNTER — Ambulatory Visit: Payer: No Typology Code available for payment source | Admitting: Internal Medicine

## 2018-06-29 ENCOUNTER — Ambulatory Visit (INDEPENDENT_AMBULATORY_CARE_PROVIDER_SITE_OTHER): Payer: No Typology Code available for payment source | Admitting: Internal Medicine

## 2018-06-29 VITALS — BP 122/78 | HR 65 | Temp 98.2°F | Ht 70.0 in | Wt 298.4 lb

## 2018-06-29 DIAGNOSIS — Z6841 Body Mass Index (BMI) 40.0 and over, adult: Secondary | ICD-10-CM

## 2018-06-29 DIAGNOSIS — G7000 Myasthenia gravis without (acute) exacerbation: Secondary | ICD-10-CM | POA: Diagnosis not present

## 2018-06-29 DIAGNOSIS — R7989 Other specified abnormal findings of blood chemistry: Secondary | ICD-10-CM | POA: Diagnosis not present

## 2018-06-29 DIAGNOSIS — R7309 Other abnormal glucose: Secondary | ICD-10-CM | POA: Diagnosis not present

## 2018-06-29 DIAGNOSIS — E66813 Obesity, class 3: Secondary | ICD-10-CM

## 2018-06-29 DIAGNOSIS — I10 Essential (primary) hypertension: Secondary | ICD-10-CM

## 2018-06-29 NOTE — Patient Instructions (Signed)
DASH Eating Plan  DASH stands for "Dietary Approaches to Stop Hypertension." The DASH eating plan is a healthy eating plan that has been shown to reduce high blood pressure (hypertension). It may also reduce your risk for type 2 diabetes, heart disease, and stroke. The DASH eating plan may also help with weight loss.  What are tips for following this plan?    General guidelines   Avoid eating more than 2,300 mg (milligrams) of salt (sodium) a day. If you have hypertension, you may need to reduce your sodium intake to 1,500 mg a day.   Limit alcohol intake to no more than 1 drink a day for nonpregnant women and 2 drinks a day for men. One drink equals 12 oz of beer, 5 oz of wine, or 1 oz of hard liquor.   Work with your health care provider to maintain a healthy body weight or to lose weight. Ask what an ideal weight is for you.   Get at least 30 minutes of exercise that causes your heart to beat faster (aerobic exercise) most days of the week. Activities may include walking, swimming, or biking.   Work with your health care provider or diet and nutrition specialist (dietitian) to adjust your eating plan to your individual calorie needs.  Reading food labels     Check food labels for the amount of sodium per serving. Choose foods with less than 5 percent of the Daily Value of sodium. Generally, foods with less than 300 mg of sodium per serving fit into this eating plan.   To find whole grains, look for the word "whole" as the first word in the ingredient list.  Shopping   Buy products labeled as "low-sodium" or "no salt added."   Buy fresh foods. Avoid canned foods and premade or frozen meals.  Cooking   Avoid adding salt when cooking. Use salt-free seasonings or herbs instead of table salt or sea salt. Check with your health care provider or pharmacist before using salt substitutes.   Do not fry foods. Cook foods using healthy methods such as baking, boiling, grilling, and broiling instead.   Cook with  heart-healthy oils, such as olive, canola, soybean, or sunflower oil.  Meal planning   Eat a balanced diet that includes:  ? 5 or more servings of fruits and vegetables each day. At each meal, try to fill half of your plate with fruits and vegetables.  ? Up to 6-8 servings of whole grains each day.  ? Less than 6 oz of lean meat, poultry, or fish each day. A 3-oz serving of meat is about the same size as a deck of cards. One egg equals 1 oz.  ? 2 servings of low-fat dairy each day.  ? A serving of nuts, seeds, or beans 5 times each week.  ? Heart-healthy fats. Healthy fats called Omega-3 fatty acids are found in foods such as flaxseeds and coldwater fish, like sardines, salmon, and mackerel.   Limit how much you eat of the following:  ? Canned or prepackaged foods.  ? Food that is high in trans fat, such as fried foods.  ? Food that is high in saturated fat, such as fatty meat.  ? Sweets, desserts, sugary drinks, and other foods with added sugar.  ? Full-fat dairy products.   Do not salt foods before eating.   Try to eat at least 2 vegetarian meals each week.   Eat more home-cooked food and less restaurant, buffet, and fast food.     When eating at a restaurant, ask that your food be prepared with less salt or no salt, if possible.  What foods are recommended?  The items listed may not be a complete list. Talk with your dietitian about what dietary choices are best for you.  Grains  Whole-grain or whole-wheat bread. Whole-grain or whole-wheat pasta. Mallick rice. Oatmeal. Quinoa. Bulgur. Whole-grain and low-sodium cereals. Pita bread. Low-fat, low-sodium crackers. Whole-wheat flour tortillas.  Vegetables  Fresh or frozen vegetables (raw, steamed, roasted, or grilled). Low-sodium or reduced-sodium tomato and vegetable juice. Low-sodium or reduced-sodium tomato sauce and tomato paste. Low-sodium or reduced-sodium canned vegetables.  Fruits  All fresh, dried, or frozen fruit. Canned fruit in natural juice (without  added sugar).  Meat and other protein foods  Skinless chicken or turkey. Ground chicken or turkey. Pork with fat trimmed off. Fish and seafood. Egg whites. Dried beans, peas, or lentils. Unsalted nuts, nut butters, and seeds. Unsalted canned beans. Lean cuts of beef with fat trimmed off. Low-sodium, lean deli meat.  Dairy  Low-fat (1%) or fat-free (skim) milk. Fat-free, low-fat, or reduced-fat cheeses. Nonfat, low-sodium ricotta or cottage cheese. Low-fat or nonfat yogurt. Low-fat, low-sodium cheese.  Fats and oils  Soft margarine without trans fats. Vegetable oil. Low-fat, reduced-fat, or light mayonnaise and salad dressings (reduced-sodium). Canola, safflower, olive, soybean, and sunflower oils. Avocado.  Seasoning and other foods  Herbs. Spices. Seasoning mixes without salt. Unsalted popcorn and pretzels. Fat-free sweets.  What foods are not recommended?  The items listed may not be a complete list. Talk with your dietitian about what dietary choices are best for you.  Grains  Baked goods made with fat, such as croissants, muffins, or some breads. Dry pasta or rice meal packs.  Vegetables  Creamed or fried vegetables. Vegetables in a cheese sauce. Regular canned vegetables (not low-sodium or reduced-sodium). Regular canned tomato sauce and paste (not low-sodium or reduced-sodium). Regular tomato and vegetable juice (not low-sodium or reduced-sodium). Pickles. Olives.  Fruits  Canned fruit in a light or heavy syrup. Fried fruit. Fruit in cream or butter sauce.  Meat and other protein foods  Fatty cuts of meat. Ribs. Fried meat. Bacon. Sausage. Bologna and other processed lunch meats. Salami. Fatback. Hotdogs. Bratwurst. Salted nuts and seeds. Canned beans with added salt. Canned or smoked fish. Whole eggs or egg yolks. Chicken or turkey with skin.  Dairy  Whole or 2% milk, cream, and half-and-half. Whole or full-fat cream cheese. Whole-fat or sweetened yogurt. Full-fat cheese. Nondairy creamers. Whipped toppings.  Processed cheese and cheese spreads.  Fats and oils  Butter. Stick margarine. Lard. Shortening. Ghee. Bacon fat. Tropical oils, such as coconut, palm kernel, or palm oil.  Seasoning and other foods  Salted popcorn and pretzels. Onion salt, garlic salt, seasoned salt, table salt, and sea salt. Worcestershire sauce. Tartar sauce. Barbecue sauce. Teriyaki sauce. Soy sauce, including reduced-sodium. Steak sauce. Canned and packaged gravies. Fish sauce. Oyster sauce. Cocktail sauce. Horseradish that you find on the shelf. Ketchup. Mustard. Meat flavorings and tenderizers. Bouillon cubes. Hot sauce and Tabasco sauce. Premade or packaged marinades. Premade or packaged taco seasonings. Relishes. Regular salad dressings.  Where to find more information:   National Heart, Lung, and Blood Institute: www.nhlbi.nih.gov   American Heart Association: www.heart.org  Summary   The DASH eating plan is a healthy eating plan that has been shown to reduce high blood pressure (hypertension). It may also reduce your risk for type 2 diabetes, heart disease, and stroke.   With the   DASH eating plan, you should limit salt (sodium) intake to 2,300 mg a day. If you have hypertension, you may need to reduce your sodium intake to 1,500 mg a day.   When on the DASH eating plan, aim to eat more fresh fruits and vegetables, whole grains, lean proteins, low-fat dairy, and heart-healthy fats.   Work with your health care provider or diet and nutrition specialist (dietitian) to adjust your eating plan to your individual calorie needs.  This information is not intended to replace advice given to you by your health care provider. Make sure you discuss any questions you have with your health care provider.  Document Released: 02/11/2011 Document Revised: 02/16/2016 Document Reviewed: 02/16/2016  Elsevier Interactive Patient Education  2019 Elsevier Inc.

## 2018-06-29 NOTE — Progress Notes (Signed)
Subjective:     Patient ID: Grant Hernandez , male    DOB: Feb 11, 1976 , 43 y.o.   MRN: [id number]   Chief Complaint  Patient presents with  . Hypertension    HPI  He presents today for bp check. He was previously seen by Sunday Spillers, [place].  He reports compliance with meds. He does not have any particular concerns at this time.   Hypertension  This is a chronic problem. The current episode started more than 1 year ago. The problem is controlled. Pertinent negatives include no blurred vision, chest pain, palpitations or shortness of breath. Risk factors for coronary artery disease include obesity and male gender.     Past Medical History:  Diagnosis Date  . 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 Hypertension   . MRSA (methicillin resistant Staphylococcus aureus) 2010   right axilla  . Myasthenia gravis ([place]   . Pectoralis muscle rupture      Family History  Problem Relation Age of Onset  . Healthy Mother   . Other Father        unknown     Current Outpatient Medications:  .  aspirin-acetaminophen-caffeine (EXCEDRIN MIGRAINE) 250-250-65 MG tablet, Take by mouth every 6 (six) hours as needed for headache., Disp: , Rfl:  .  mycophenolate (CELLCEPT) 250 MG capsule, Take by mouth., Disp: , Rfl:  .  mycophenolate (CELLCEPT) 500 MG tablet, Take by mouth., Disp: , Rfl:  .  nebivolol (BYSTOLIC) 2.5 MG tablet, Take 1 tablet (2.5 mg total) by mouth daily., Disp: 30 tablet, Rfl: 0 .  omeprazole (PRILOSEC) 40 MG capsule, , Disp: , Rfl:  .  Pyridostigmine Bromide (MESTINON PO), Take 60 mg by mouth 2 (two) times daily. , Disp: , Rfl:    No Known Allergies   Review of Systems  Constitutional: Negative.   Eyes: Negative for blurred vision.  Respiratory: Negative.  Negative for shortness of breath.   Cardiovascular: Negative.  Negative for chest pain and palpitations.  Gastrointestinal: Negative.   Neurological:  Negative.   Psychiatric/Behavioral: Negative.      Today's Vitals   06/29/18 0900  BP: 122/78  Pulse: 65  Temp: 98.2 F (36.8 C)  Weight: 298 lb 6.4 oz (135.4 kg)  Height: 5\' 10"  (1.778 m)   Body mass index is 42.82 kg/m.   Objective:  Physical Exam Vitals signs and nursing note reviewed.  Constitutional:      Appearance: Normal appearance.  HENT:     Head: Normocephalic and atraumatic.     Comments: Left eye ptosis Cardiovascular:     Rate and Rhythm: Normal rate and regular rhythm.     Heart sounds: Normal heart sounds.  Pulmonary:     Effort: Pulmonary effort is normal.     Breath sounds: Normal breath sounds.  Skin:    General: Skin is warm.  Neurological:     General: No focal deficit present.     Mental Status: He is alert.  Psychiatric:        Mood and Affect: Mood normal.         Assessment And Plan:     1. Essential hypertension  Well controlled. He will continue with current meds. He is encouraged to avoid adding salt to his foods. He will rto in Summer 2020 for his next physical examination.   2. Myasthenia gravis (HCC)  Chronic, yet stable. He will continue with current  meds.   3. Low vitamin D level  I WILL CHECK A VIT D LEVEL AND SUPPLEMENT AS NEEDED.  HE IS ALSO ENCOURAGED TO SPEND 15 MINUTES IN THE SUN DAILY.  - Vitamin D (25 hydroxy)  4. Other abnormal glucose  HIS A1C HAS BEEN ELEVATED IN THE PAST. I WILL CHECK AN A1C, BMET TODAY. HE WAS ENCOURAGED TO AVOID SUGARY BEVERAGES AND PROCESSED FOODS INCLUDNG BREADS, RICE AND PASTA.  - Hemoglobin A1c; Future  5. Class 3 severe obesity due to excess calories without serious comorbidity with body mass index (BMI) of 40.0 to 44.9 in adult [facility])  He is encouraged to lose ten percent of his body weight. He is congratulated on his weight loss thus far. Importance of achieving optimal weight to decrease risk of cardiovascular disease and cancers was discussed with the patient in full detail. She is  encouraged to start slowly - start with 10 minutes twice daily at least three to four days per week and to gradually build to 30 minutes five days weekly. He was given tips to incorporate more activity into his daily routine - take stairs when possible, park farther away from his job, grocery stores, etc.     Maximino Greenland, MD    THE PATIENT IS ENCOURAGED TO PRACTICE SOCIAL DISTANCING DUE TO THE COVID-19 PANDEMIC.

## 2018-06-30 ENCOUNTER — Other Ambulatory Visit: Payer: Self-pay

## 2018-06-30 LAB — VITAMIN D 25 HYDROXY (VIT D DEFICIENCY, FRACTURES): Vit D, 25-Hydroxy: 12.5 ng/mL — ABNORMAL LOW (ref 30.0–100.0)

## 2018-06-30 MED ORDER — VITAMIN D3 1.25 MG (50000 UT) PO CAPS: ORAL_CAPSULE | ORAL | 2 refills | Status: DC

## 2018-08-10 ENCOUNTER — Ambulatory Visit: Payer: No Typology Code available for payment source | Admitting: Internal Medicine

## 2018-10-31 ENCOUNTER — Encounter: Payer: 59 | Admitting: Internal Medicine

## 2018-10-31 ENCOUNTER — Encounter: Payer: No Typology Code available for payment source | Admitting: Nurse Practitioner

## 2018-11-30 ENCOUNTER — Other Ambulatory Visit: Payer: Self-pay

## 2018-11-30 ENCOUNTER — Ambulatory Visit: Payer: No Typology Code available for payment source | Admitting: Nurse Practitioner

## 2018-11-30 ENCOUNTER — Encounter: Payer: Self-pay | Admitting: Nurse Practitioner

## 2018-11-30 VITALS — BP 124/78 | HR 65 | Temp 98.3°F | Ht 71.8 in | Wt 288.8 lb

## 2018-11-30 DIAGNOSIS — G4733 Obstructive sleep apnea (adult) (pediatric): Secondary | ICD-10-CM

## 2018-11-30 DIAGNOSIS — R030 Elevated blood-pressure reading, without diagnosis of hypertension: Secondary | ICD-10-CM

## 2018-11-30 DIAGNOSIS — Z139 Encounter for screening, unspecified: Secondary | ICD-10-CM

## 2018-11-30 DIAGNOSIS — Z125 Encounter for screening for malignant neoplasm of prostate: Secondary | ICD-10-CM

## 2018-11-30 DIAGNOSIS — Z0000 Encounter for general adult medical examination without abnormal findings: Secondary | ICD-10-CM

## 2018-11-30 DIAGNOSIS — R7989 Other specified abnormal findings of blood chemistry: Secondary | ICD-10-CM

## 2018-11-30 DIAGNOSIS — N529 Male erectile dysfunction, unspecified: Secondary | ICD-10-CM

## 2018-11-30 DIAGNOSIS — E669 Obesity, unspecified: Secondary | ICD-10-CM

## 2018-11-30 DIAGNOSIS — Z1211 Encounter for screening for malignant neoplasm of colon: Secondary | ICD-10-CM | POA: Diagnosis not present

## 2018-11-30 DIAGNOSIS — I10 Essential (primary) hypertension: Secondary | ICD-10-CM

## 2018-11-30 DIAGNOSIS — G7000 Myasthenia gravis without (acute) exacerbation: Secondary | ICD-10-CM

## 2018-11-30 HISTORY — DX: Male erectile dysfunction, unspecified: N52.9

## 2018-11-30 LAB — POCT URINALYSIS DIPSTICK
Bilirubin, UA: NEGATIVE
Blood, UA: NEGATIVE
Glucose, UA: NEGATIVE
Ketones, UA: POSITIVE
Leukocytes, UA: NEGATIVE
Nitrite, UA: NEGATIVE
Protein, UA: NEGATIVE
Spec Grav, UA: 1.025 (ref 1.010–1.025)
Urobilinogen, UA: 0.2 E.U./dL
pH, UA: 5.5 (ref 5.0–8.0)

## 2018-11-30 LAB — POCT UA - MICROALBUMIN
Albumin/Creatinine Ratio, Urine, POC: 30
Creatinine, POC: 300 mg/dL
Microalbumin Ur, POC: 10 mg/L

## 2018-11-30 MED ORDER — VITAMIN D3 1.25 MG (50000 UT) PO CAPS: ORAL_CAPSULE | ORAL | 2 refills | Status: DC

## 2018-11-30 MED ORDER — LISINOPRIL 2.5 MG PO TABS: 2.5000 mg | ORAL_TABLET | Freq: Every day | ORAL | 1 refills | Status: DC

## 2018-11-30 NOTE — Progress Notes (Addendum)
Subjective:     Patient ID: Grant Hernandez , male    DOB: May 21, 1975 , 43 y.o.   MRN: [id number]   Chief Complaint  Patient presents with  . Annual Exam    HPI  Here for HM  Wt Readings from Last 3 Encounters: 11/30/18 : 288 lb 12.8 oz (131 kg) 06/29/18 : 298 lb 6.4 oz (135.4 kg) 05/11/18 : 299 lb (135.6 kg)   Hypertension This is a chronic problem. The current episode started more than 1 year ago. The problem is controlled. Pertinent negatives include no blurred vision, chest pain, headaches, palpitations or shortness of breath. Risk factors for coronary artery disease include obesity and male gender.     Past Medical History:  Diagnosis Date  . 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 Hypertension   . MRSA (methicillin resistant Staphylococcus aureus) 2010   right axilla  . Myasthenia gravis ([place])   . Pectoralis muscle rupture      Family History  Problem Relation Age of Onset  . Healthy Mother   . Other Father        unknown     Current Outpatient Medications:  .  aspirin-acetaminophen-caffeine (EXCEDRIN MIGRAINE) 250-250-65 MG tablet, Take by mouth every 6 (six) hours as needed for headache., Disp: , Rfl:  .  lisinopril (ZESTRIL) 2.5 MG tablet, Take 2.5 mg by mouth daily., Disp: , Rfl:  .  mycophenolate (CELLCEPT) 250 MG capsule, Take by mouth., Disp: , Rfl:  .  mycophenolate (CELLCEPT) 500 MG tablet, Take by mouth., Disp: , Rfl:  .  nebivolol (BYSTOLIC) 2.5 MG tablet, Take 1 tablet (2.5 mg total) by mouth daily., Disp: 30 tablet, Rfl: 0 .  omeprazole (PRILOSEC) 40 MG capsule, , Disp: , Rfl:  .  Pyridostigmine Bromide (MESTINON PO), Take 60 mg by mouth 2 (two) times daily. , Disp: , Rfl:  .  Cholecalciferol (VITAMIN D3) 1.25 MG (50000 UT) CAPS, Take 1 capsule by mouth on Tuesday and Friday (Patient not taking: Reported on 11/30/2018), Disp: 8 capsule, Rfl: 2   No Known Allergies    Review of Systems  Constitutional: Negative.   HENT: Negative.   Eyes: Negative.  Negative for blurred vision.  Respiratory: Negative.  Negative for shortness of breath.   Cardiovascular: Negative for chest pain and palpitations.  Endocrine: Negative.   Genitourinary: Negative.   Musculoskeletal: Negative.   Skin: Negative.   Allergic/Immunologic: Negative.   Neurological: Negative for dizziness and headaches.  Hematological: Negative.   Psychiatric/Behavioral: Negative.      Today's Vitals   11/30/18 1509  BP: 124/78  Pulse: 65  Temp: 98.3 F (36.8 C)  TempSrc: Oral  Weight: 288 lb 12.8 oz (131 kg)  Height: 5' 11.8" (1.824 m)  PainSc: 0-No pain   Body mass index is 39.39 kg/m.   Objective:  Physical Exam Vitals signs reviewed.  Constitutional:      Appearance: Normal appearance. He is obese.  HENT:     Head: Normocephalic and atraumatic.     Right Ear: Tympanic membrane, ear canal and external ear normal. There is no impacted cerumen.     Left Ear: Tympanic membrane, ear canal and external ear normal. There is no impacted cerumen.  Eyes:     Extraocular Movements: Extraocular movements intact.     Conjunctiva/sclera: Conjunctivae normal.     Pupils: Pupils are equal, round, and reactive to light.  Neck:  Musculoskeletal: Normal range of motion and neck supple.  Cardiovascular:     Rate and Rhythm: Normal rate and regular rhythm.     Pulses: Normal pulses.     Heart sounds: Normal heart sounds. No murmur.  Pulmonary:     Effort: Pulmonary effort is normal. No respiratory distress.     Breath sounds: Normal breath sounds.  Abdominal:     General: Abdomen is flat. Bowel sounds are normal. There is no distension.     Palpations: Abdomen is soft.     Tenderness: There is no abdominal tenderness.  Genitourinary:    Prostate: Normal.     Rectum: Guaiac result negative.  Musculoskeletal: Normal range of motion.  Skin:    General: Skin is warm.      Capillary Refill: Capillary refill takes less than 2 seconds.  Neurological:     General: No focal deficit present.     Mental Status: He is alert and oriented to person, place, and time.  Psychiatric:        Mood and Affect: Mood normal.        Behavior: Behavior normal.        Thought Content: Thought content normal.        Judgment: Judgment normal.         Assessment And Plan:     1. Health maintenance examination Behavior modifications discussed and diet history reviewed.   Pt will continue to exercise regularly and modify diet with low GI, plant based foods and decrease intake of processed foods.  Recommend intake of daily multivitamin, Vitamin D, and calcium.  Recommend for preventive screenings, as well as recommend immunizations that include influenza, TDAP  2. essential hypertension . B/P is well controlled.  . CMP ordered to check renal function.  . The importance of regular exercise and dietary modification was stressed to the patient.  . Stressed importance of losing ten percent of her body weight to help with B/P control.  . EKG done NSR HR 59 - POCT Urinalysis Dipstick (81002) - POCT UA - Microalbumin - EKG 12-Lead  3. Low vitamin D level  Will check vitamin D level and supplement as needed.     Also encouraged to spend 15 minutes in the sun daily.   4. Obesity (BMI 35.0-39.9 without comorbidity)  Chronic  Discussed healthy diet and regular exercise options   Encouraged to exercise at least 150 minutes per week with 2 days of strength training  He was congratulated on his 10 lb weight loss  5. Erectile dysfunction, unspecified erectile dysfunction type  He is lacking the interest in intercourse  I will check a PSA and testosterone level - PSA  6. Encounter for screening  - HIV antibody (with reflex)  7. Encounter for prostate cancer screening  - PSA  8. Myasthenia gravis ([place])  Chronic, stable  Continue follow up with neurology  9.  Obstructive sleep apnea syndrome  He was given an option to keep the CPAP in which he has declined at this time - Hemoglobin A1c    [name], FNP    THE PATIENT IS ENCOURAGED TO PRACTICE SOCIAL DISTANCING DUE TO THE COVID-19 PANDEMIC.

## 2018-11-30 NOTE — Patient Instructions (Signed)
Health Maintenance  Topic Date Due  . HIV Screening  10/26/1988  . INFLUENZA VACCINE  01/01/2019 (Originally 10/07/2018)  . TETANUS/TDAP  11/06/2026   Health Maintenance After Age 45 After age 52, you are at a higher risk for certain long-term diseases and infections as well as injuries from falls. Falls are a major cause of broken bones and head injuries in people who are older than age 72. Getting regular preventive care can help to keep you healthy and well. Preventive care includes getting regular testing and making lifestyle changes as recommended by your health care provider. Talk with your health care provider about:  Which screenings and tests you should have. A screening is a test that checks for a disease when you have no symptoms.  A diet and exercise plan that is right for you. What should I know about screenings and tests to prevent falls? Screening and testing are the best ways to find a health problem early. Early diagnosis and treatment give you the best chance of managing medical conditions that are common after age 18. Certain conditions and lifestyle choices may make you more likely to have a fall. Your health care provider may recommend:  Regular vision checks. Poor vision and conditions such as cataracts can make you more likely to have a fall. If you wear glasses, make sure to get your prescription updated if your vision changes.  Medicine review. Work with your health care provider to regularly review all of the medicines you are taking, including over-the-counter medicines. Ask your health care provider about any side effects that may make you more likely to have a fall. Tell your health care provider if any medicines that you take make you feel dizzy or sleepy.  Osteoporosis screening. Osteoporosis is a condition that causes the bones to get weaker. This can make the bones weak and cause them to break more easily.  Blood pressure screening. Blood pressure changes and  medicines to control blood pressure can make you feel dizzy.  Strength and balance checks. Your health care provider may recommend certain tests to check your strength and balance while standing, walking, or changing positions.  Foot health exam. Foot pain and numbness, as well as not wearing proper footwear, can make you more likely to have a fall.  Depression screening. You may be more likely to have a fall if you have a fear of falling, feel emotionally low, or feel unable to do activities that you used to do.  Alcohol use screening. Using too much alcohol can affect your balance and may make you more likely to have a fall. What actions can I take to lower my risk of falls? General instructions  Talk with your health care provider about your risks for falling. Tell your health care provider if: ? You fall. Be sure to tell your health care provider about all falls, even ones that seem minor. ? You feel dizzy, sleepy, or off-balance.  Take over-the-counter and prescription medicines only as told by your health care provider. These include any supplements.  Eat a healthy diet and maintain a healthy weight. A healthy diet includes low-fat dairy products, low-fat (lean) meats, and fiber from whole grains, beans, and lots of fruits and vegetables. Home safety  Remove any tripping hazards, such as rugs, cords, and clutter.  Install safety equipment such as grab bars in bathrooms and safety rails on stairs.  Keep rooms and walkways well-lit. Activity   Follow a regular exercise program to stay fit.  This will help you maintain your balance. Ask your health care provider what types of exercise are appropriate for you.  If you need a cane or walker, use it as recommended by your health care provider.  Wear supportive shoes that have nonskid soles. Lifestyle  Do not drink alcohol if your health care provider tells you not to drink.  If you drink alcohol, limit how much you have: ? 0-1  drink a day for women. ? 0-2 drinks a day for men.  Be aware of how much alcohol is in your drink. In the [place] one drink equals one typical bottle of beer (12 oz), one-half glass of wine (5 oz), or one shot of hard liquor (1 oz).  Do not use any products that contain nicotine or tobacco, such as cigarettes and e-cigarettes. If you need help quitting, ask your health care provider. Summary  Having a healthy lifestyle and getting preventive care can help to protect your health and wellness after age 7.  Screening and testing are the best way to find a health problem early and help you avoid having a fall. Early diagnosis and treatment give you the best chance for managing medical conditions that are more common for people who are older than age 16.  Falls are a major cause of broken bones and head injuries in people who are older than age 37. Take precautions to prevent a fall at home.  Work with your health care provider to learn what changes you can make to improve your health and wellness and to prevent falls. This information is not intended to replace advice given to you by your health care provider. Make sure you discuss any questions you have with your health care provider. Document Released: 01/05/2017 Document Revised: 06/15/2018 Document Reviewed: 01/05/2017 Elsevier Patient Education  2020 [employer].

## 2018-12-01 LAB — CBC
Hematocrit: 43.1 % (ref 37.5–51.0)
Hemoglobin: 13.9 g/dL (ref 13.0–17.7)
MCH: 27.4 pg (ref 26.6–33.0)
MCHC: 32.3 g/dL (ref 31.5–35.7)
MCV: 85 fL (ref 79–97)
Platelets: 252 10*3/uL (ref 150–450)
RBC: 5.08 x10E6/uL (ref 4.14–5.80)
RDW: 13.5 % (ref 11.6–15.4)
WBC: 5.3 10*3/uL (ref 3.4–10.8)

## 2018-12-01 LAB — CMP14 + ANION GAP
ALT: 28 IU/L (ref 0–44)
AST: 30 IU/L (ref 0–40)
Albumin/Globulin Ratio: 1.6 (ref 1.2–2.2)
Albumin: 4.6 g/dL (ref 4.0–5.0)
Alkaline Phosphatase: 76 IU/L (ref 39–117)
Anion Gap: 14 mmol/L (ref 10.0–18.0)
BUN/Creatinine Ratio: 14 (ref 9–20)
BUN: 16 mg/dL (ref 6–24)
Bilirubin Total: 0.3 mg/dL (ref 0.0–1.2)
CO2: 22 mmol/L (ref 20–29)
Calcium: 9.9 mg/dL (ref 8.7–10.2)
Chloride: 104 mmol/L (ref 96–106)
Creatinine, Ser: 1.15 mg/dL (ref 0.76–1.27)
GFR calc Af Amer: 88 mL/min/{1.73_m2} (ref >59)
GFR calc non Af Amer: 76 mL/min/{1.73_m2} (ref >59)
Globulin, Total: 2.8 g/dL (ref 1.5–4.5)
Glucose: 91 mg/dL (ref 65–99)
Potassium: 4.2 mmol/L (ref 3.5–5.2)
Sodium: 140 mmol/L (ref 134–144)
Total Protein: 7.4 g/dL (ref 6.0–8.5)

## 2018-12-01 LAB — TSH: TSH: 1.85 u[IU]/mL (ref 0.450–4.500)

## 2018-12-01 LAB — HEMOGLOBIN A1C
Est. average glucose Bld gHb Est-mCnc: 131 mg/dL
Hgb A1c MFr Bld: 6.2 % — ABNORMAL HIGH (ref 4.8–5.6)

## 2018-12-01 LAB — PSA: Prostate Specific Ag, Serum: 0.7 ng/mL (ref 0.0–4.0)

## 2018-12-01 LAB — TESTOSTERONE: Testosterone: 209 ng/dL — ABNORMAL LOW (ref 264–916)

## 2018-12-01 LAB — HIV ANTIBODY (ROUTINE TESTING W REFLEX): HIV Screen 4th Generation wRfx: NONREACTIVE

## 2018-12-18 ENCOUNTER — Other Ambulatory Visit: Payer: Self-pay

## 2018-12-18 MED ORDER — TESTOSTERONE 12.5 MG/ACT (1%) TD GEL: 1.0000 "application " | Freq: Every day | TRANSDERMAL | 3 refills | Status: DC

## 2019-01-05 ENCOUNTER — Other Ambulatory Visit: Payer: Self-pay

## 2019-01-05 DIAGNOSIS — Z20822 Contact with and (suspected) exposure to covid-19: Secondary | ICD-10-CM

## 2019-01-06 LAB — NOVEL CORONAVIRUS, NAA: SARS-CoV-2, NAA: DETECTED — AB

## 2019-02-21 IMAGING — MR MR CHEST MEDIASTINUM W/O CM
5 series · 16 of 16 positions shown · non-contrast
Comparison: Chest CT 09/06/2007.

CLINICAL DATA: Lifting injury yesterday with right lateral chest
pain. Evaluate for pectoralis muscle injury.

EXAM:
MRI CHEST WITHOUT CONTRAST
TECHNIQUE: Multiplanar, multiecho pulse sequences of the upper right chest with
attention to the pectoralis musculature were obtained without
intravenous contrast.

[Series 5: T1 · axial · 4.0mm · 0.65mm/px · z∈[-40,+133]mm · 3 of 37 slices shown (1 of 2)]
[im 1/37]
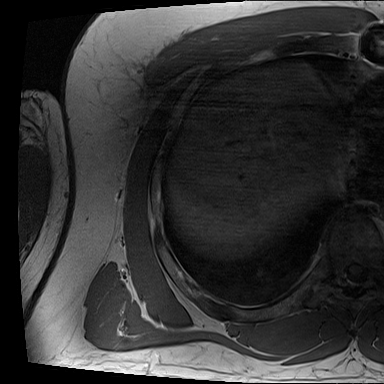
[im 19/37]
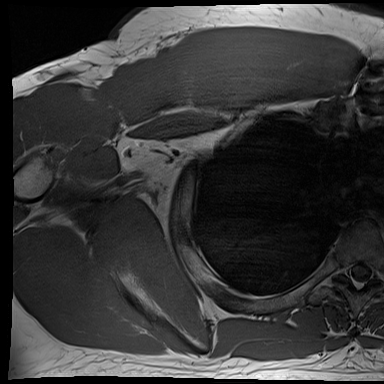
[im 37/37]
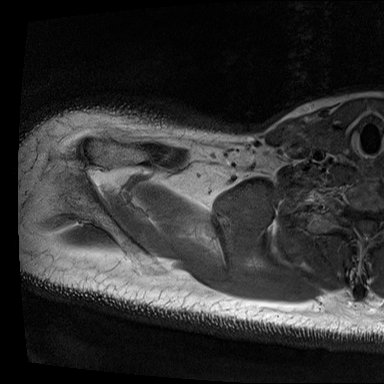

[Series 7: T1 · coronal · 4.0mm · 0.68mm/px · 3 of 30 slices shown (2 of 2)]
[im 1/30]
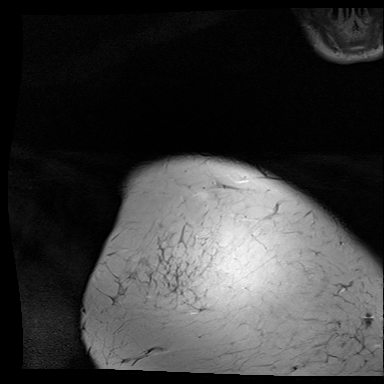
[im 15/30]
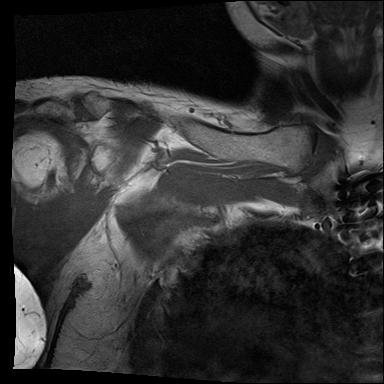
[im 30/30]
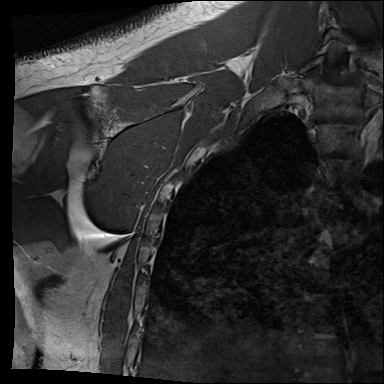

[Series 8: STIR · coronal · 4.0mm · 0.68mm/px · 3 of 30 slices shown]
[im 1/30]
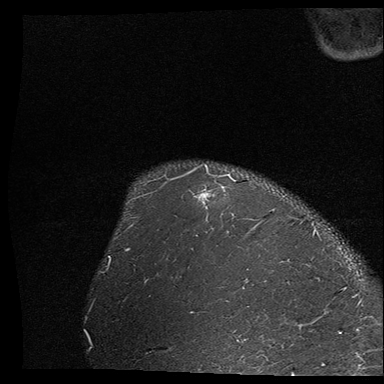
[im 15/30]
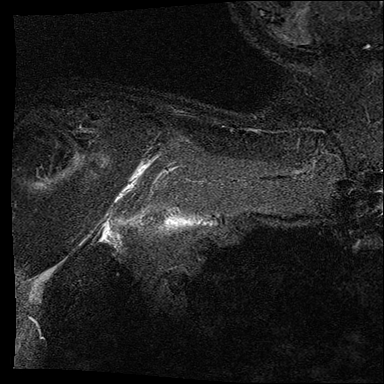
[im 30/30]
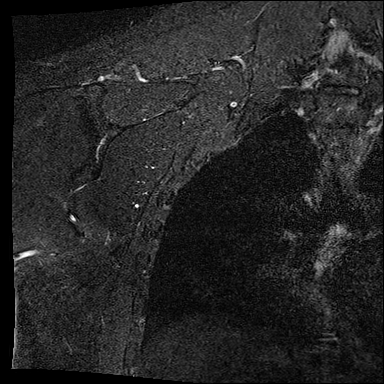

[Series 10: T2 fat-sat · axial · 4.0mm · 0.65mm/px · z∈[-40,+133]mm · 4 of 37 slices shown]
[im 1/37]
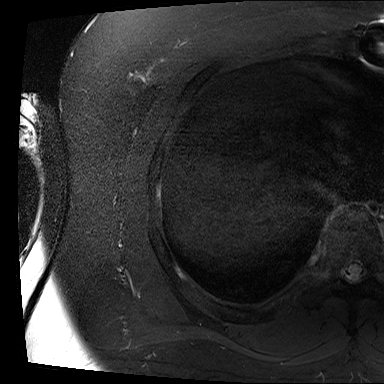
[im 13/37]
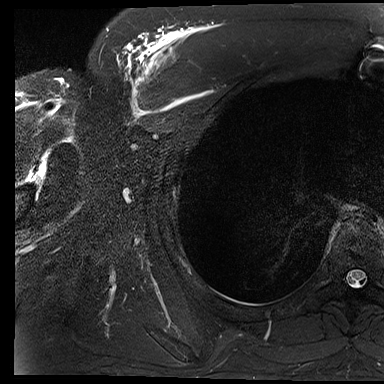
[im 25/37]
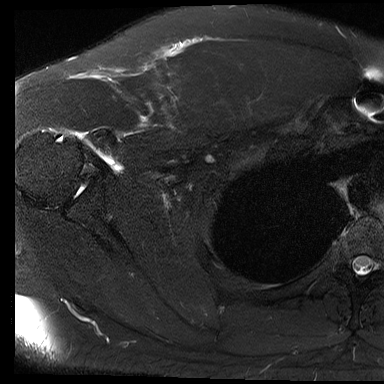
[im 37/37]
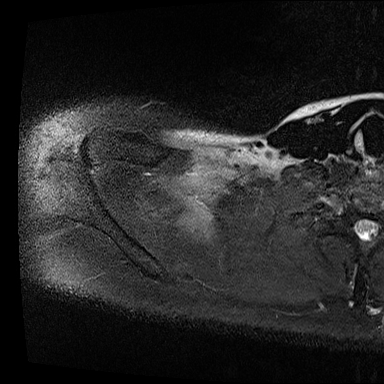

[Series 11: PD fat-sat · sagittal · right · 4.0mm · 0.62mm/px · 3 of 30 slices shown]
[im 1/30]
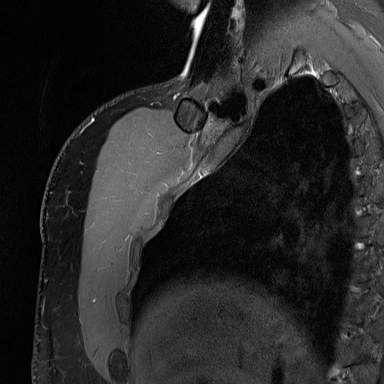
[im 15/30]
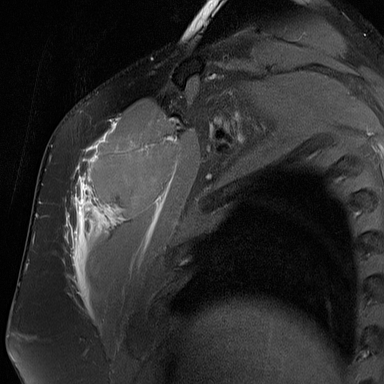
[im 30/30]
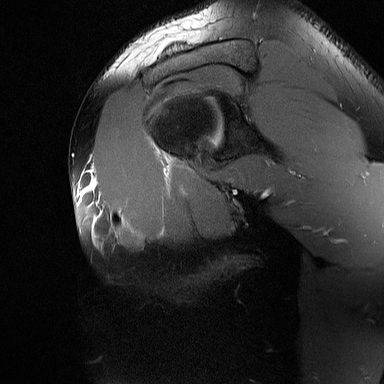

[16 of 16 positions shown; findings below may reference images not displayed]

FINDINGS: Bones/Joint/Cartilage

The visualized right ribs, proximal right humerus, right clavicle
and scapula appear unremarkable. Mild acromioclavicular degenerative
changes are present.

Ligaments

Not relevant for exam/indication.

Muscles and Tendons
There is prominent soft tissue edema within and surrounding the
musculotendinous junction of the right pectoralis major. Edema and
ill-defined fluid extend laterally, tracking along the anteromedial
aspect of the proximal humerus, best seen on the coronal images. The
pectoralis major tendon is not well visualized, although appears
medially retracted. The biceps tendon appears intact and normally
located. The rotator cuff musculature appears unremarkable.

Soft tissues
As above, there is subcutaneous edema and ill-defined fluid
surrounding the distal biceps tendon and extending along the medial
aspect of the proximal humerus. No drainable fluid collections are
seen.
IMPRESSION: 1. Findings are consistent with a complete tear of the pectoralis
tendon or the musculotendinous junction. Complete insertional tear
of the pectoralis tendon with medial retraction and associated
muscle strain or partial tear at the musculotendinous junction
favored.
2. No significant osseous findings.

## 2019-02-23 ENCOUNTER — Other Ambulatory Visit: Payer: Self-pay | Admitting: Nurse Practitioner

## 2019-02-23 DIAGNOSIS — R7989 Other specified abnormal findings of blood chemistry: Secondary | ICD-10-CM

## 2019-05-24 ENCOUNTER — Other Ambulatory Visit: Payer: Self-pay

## 2019-05-24 ENCOUNTER — Encounter: Payer: Self-pay | Admitting: Internal Medicine

## 2019-05-24 ENCOUNTER — Ambulatory Visit: Payer: No Typology Code available for payment source | Admitting: Internal Medicine

## 2019-05-24 VITALS — BP 118/76 | HR 77 | Temp 98.6°F | Ht 71.8 in | Wt 287.0 lb

## 2019-05-24 DIAGNOSIS — I10 Essential (primary) hypertension: Secondary | ICD-10-CM | POA: Diagnosis not present

## 2019-05-24 DIAGNOSIS — Z6841 Body Mass Index (BMI) 40.0 and over, adult: Secondary | ICD-10-CM

## 2019-05-24 DIAGNOSIS — E782 Mixed hyperlipidemia: Secondary | ICD-10-CM

## 2019-05-24 DIAGNOSIS — Z131 Encounter for screening for diabetes mellitus: Secondary | ICD-10-CM

## 2019-05-24 DIAGNOSIS — R7989 Other specified abnormal findings of blood chemistry: Secondary | ICD-10-CM

## 2019-05-24 DIAGNOSIS — Z6839 Body mass index (BMI) 39.0-39.9, adult: Secondary | ICD-10-CM

## 2019-05-24 DIAGNOSIS — R7303 Prediabetes: Secondary | ICD-10-CM

## 2019-05-24 DIAGNOSIS — R030 Elevated blood-pressure reading, without diagnosis of hypertension: Secondary | ICD-10-CM

## 2019-05-24 DIAGNOSIS — E669 Obesity, unspecified: Secondary | ICD-10-CM

## 2019-05-24 DIAGNOSIS — E559 Vitamin D deficiency, unspecified: Secondary | ICD-10-CM

## 2019-05-24 MED ORDER — LISINOPRIL 2.5 MG PO TABS: 2.5000 mg | ORAL_TABLET | Freq: Every day | ORAL | 0 refills | Status: DC

## 2019-05-24 MED ORDER — VITAMIN D3 1.25 MG (50000 UT) PO CAPS: ORAL_CAPSULE | ORAL | 0 refills | Status: DC

## 2019-05-24 NOTE — Progress Notes (Signed)
This visit occurred during the SARS-CoV-2 public health emergency.  Safety protocols were in place, including screening questions prior to the visit, additional usage of staff PPE, and extensive cleaning of exam room while observing appropriate contact time as indicated for disinfecting solutions.  Subjective:     Patient ID: Grant Hernandez , male    DOB: [birth year] , 44 y.o.   MRN: [id number]   Chief Complaint  Patient presents with  . Hypertension    HPI  Pt is here for HTN FU. His BP;s at work have been in the 120's/70's. Has been working a lot and not having much time to exercise.   Past Medical History:  Diagnosis Date  . 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 Hypertension   . MRSA (methicillin resistant Staphylococcus aureus) 2010   right axilla  . Myasthenia gravis ([place])   . Pectoralis muscle rupture      Family History  Problem Relation Age of Onset  . Healthy Mother   . Other Father        unknown     Current Outpatient Medications:  .  Cholecalciferol (VITAMIN D3) 1.25 MG (50000 UT) CAPS, TAKE ONE (1) CAPSULE BY MOUTH ON TUESDAY AND FRIDAY, Disp: 8 capsule, Rfl: 2 .  lisinopril (ZESTRIL) 2.5 MG tablet, Take 1 tablet (2.5 mg total) by mouth daily., Disp: 90 tablet, Rfl: 1 .  mycophenolate (CELLCEPT) 250 MG capsule, Take by mouth., Disp: , Rfl:  .  mycophenolate (CELLCEPT) 500 MG tablet, Take by mouth., Disp: , Rfl:  .  omeprazole (PRILOSEC) 40 MG capsule, , Disp: , Rfl:  .  Pyridostigmine Bromide (MESTINON PO), Take 60 mg by mouth 2 (two) times daily. , Disp: , Rfl:    No Known Allergies   Review of Systems  Review of Systems  Constitutional: Negative for diaphoresis and unexpected weight change.  HENT: Negative for tinnitus.   Eyes: Negative for visual disturbance.  Respiratory: Negative for chest tightness and shortness of breath.   Cardiovascular: Negative for chest pain,  palpitations and leg swelling.  Gastrointestinal: Negative for constipation, diarrhea and nausea.  Endocrine: Negative for polydipsia, polyphagia and polyuria.  Genitourinary: Negative for dysuria and frequency.  Skin: Negative for rash and wound.  Neurological: Negative for dizziness, speech difficulty, weakness, numbness and headaches. Sometimes his fingers and feet get cold but skin does not change color   Today's Vitals   05/24/19 1135  BP: 118/76  Pulse: 77  Temp: 98.6 F (37 C)  TempSrc: Oral  SpO2: 99%  Weight: 287 lb (130.2 kg)  Height: 5' 11.8" (1.824 m)   Body mass index is 39.14 kg/m.   Objective:  Physical Exam  Hass only lost 1 lbs since 11/2018 Constitutional: She is oriented to person, place, and time. She appears well-developed and well-nourished. No distress.  HENT:  Head: Normocephalic and atraumatic.  Right Ear: External ear normal.  Left Ear: External ear normal.  Nose: Nose normal.  Eyes: Has ptosis of L. Conjunctivae are normal. Right eye exhibits no discharge. Left eye exhibits no discharge. No scleral icterus.  Neck: Neck supple. No thyromegaly present.  No carotid bruits bilaterally  Cardiovascular: Normal rate and regular rhythm.  No murmur heard. Pulmonary/Chest: Effort normal and breath sounds normal. No respiratory distress.  Musculoskeletal: Normal range of motion. She exhibits no edema.  Lymphadenopathy:    She has no cervical adenopathy.  Neurological: She is  alert and oriented to person, place, and time.  Skin: Skin is warm and dry. Capillary refill takes less than 2 seconds. No rash noted. She is not diaphoretic.  Psychiatric: She has a normal mood and affect. Her behavior is normal. Judgment and thought content normal.  Nursing note reviewed. Assessment And Plan:    1. Mixed hyperlipidemia- chronic. Will continue on low fat diet.  - Lipid Profile - CMP14 + Anion Gap - CBC no Diff - lisinopril (ZESTRIL) 2.5 MG tablet; Take 1 tablet (2.5  mg total) by mouth daily.  Dispense: 90 tablet; Refill: 0  3. Prediabetes- unknown status. Has been avoiding sweets and been on low carb diet.  - Hemoglobin A1c  4. essential hypertension- stable. May continue current medication.  - CMP14 + Anion Gap - CBC no Diff - lisinopril (ZESTRIL) 2.5 MG tablet; Take 1 tablet (2.5 mg total) by mouth daily.  Dispense: 90 tablet; Refill: 0  5. Obesity- Will start exercising and continue wt loss.   6. Vitamin D deficiency- chronic. - Vitamin D 1,25 Dihydroxy - Cholecalciferol (VITAMIN D3) 1.25 MG (50000 UT) CAPS; TAKE ONE (1) CAPSULE BY MOUTH ON TUESDAY AND FRIDAY  Dispense: 24 capsule; Refill: 0       Tera Pellicane RODRIGUEZ-SOUTHWORTH, PA-C    THE PATIENT IS ENCOURAGED TO PRACTICE SOCIAL DISTANCING DUE TO THE COVID-19 PANDEMIC.

## 2019-05-31 ENCOUNTER — Ambulatory Visit: Payer: No Typology Code available for payment source | Admitting: Internal Medicine

## 2019-05-31 LAB — CMP14 + ANION GAP
ALT: 21 IU/L (ref 0–44)
AST: 17 IU/L (ref 0–40)
Albumin/Globulin Ratio: 1.4 (ref 1.2–2.2)
Albumin: 4.4 g/dL (ref 4.0–5.0)
Alkaline Phosphatase: 65 IU/L (ref 39–117)
Anion Gap: 11 mmol/L (ref 10.0–18.0)
BUN/Creatinine Ratio: 14 (ref 9–20)
BUN: 18 mg/dL (ref 6–24)
Bilirubin Total: 0.4 mg/dL (ref 0.0–1.2)
CO2: 25 mmol/L (ref 20–29)
Calcium: 9.9 mg/dL (ref 8.7–10.2)
Chloride: 104 mmol/L (ref 96–106)
Creatinine, Ser: 1.26 mg/dL (ref 0.76–1.27)
GFR calc Af Amer: 79 mL/min/{1.73_m2} (ref >59)
GFR calc non Af Amer: 68 mL/min/{1.73_m2} (ref >59)
Globulin, Total: 3.2 g/dL (ref 1.5–4.5)
Glucose: 89 mg/dL (ref 65–99)
Potassium: 4.6 mmol/L (ref 3.5–5.2)
Sodium: 140 mmol/L (ref 134–144)
Total Protein: 7.6 g/dL (ref 6.0–8.5)

## 2019-05-31 LAB — LIPID PANEL
Chol/HDL Ratio: 3.7 ratio (ref 0.0–5.0)
Cholesterol, Total: 161 mg/dL (ref 100–199)
HDL: 43 mg/dL (ref >39)
LDL Chol Calc (NIH): 105 mg/dL — ABNORMAL HIGH (ref 0–99)
Triglycerides: 64 mg/dL (ref 0–149)
VLDL Cholesterol Cal: 13 mg/dL (ref 5–40)

## 2019-05-31 LAB — CBC
Hematocrit: 44 % (ref 37.5–51.0)
Hemoglobin: 14.1 g/dL (ref 13.0–17.7)
MCH: 26.7 pg (ref 26.6–33.0)
MCHC: 32 g/dL (ref 31.5–35.7)
MCV: 83 fL (ref 79–97)
Platelets: 247 10*3/uL (ref 150–450)
RBC: 5.28 x10E6/uL (ref 4.14–5.80)
RDW: 13.6 % (ref 11.6–15.4)
WBC: 4.7 10*3/uL (ref 3.4–10.8)

## 2019-05-31 LAB — HEMOGLOBIN A1C
Est. average glucose Bld gHb Est-mCnc: 131 mg/dL
Hgb A1c MFr Bld: 6.2 % — ABNORMAL HIGH (ref 4.8–5.6)

## 2019-05-31 LAB — VITAMIN D 1,25 DIHYDROXY
Vitamin D 1, 25 (OH)2 Total: 43 pg/mL
Vitamin D2 1, 25 (OH)2: <10 pg/mL
Vitamin D3 1, 25 (OH)2: 41 pg/mL

## 2019-08-23 ENCOUNTER — Ambulatory Visit: Payer: No Typology Code available for payment source | Admitting: Internal Medicine

## 2019-08-28 ENCOUNTER — Ambulatory Visit: Payer: No Typology Code available for payment source | Admitting: Nurse Practitioner

## 2019-08-30 ENCOUNTER — Ambulatory Visit: Payer: No Typology Code available for payment source | Admitting: Internal Medicine

## 2019-09-05 ENCOUNTER — Other Ambulatory Visit: Payer: Self-pay | Admitting: Nurse Practitioner

## 2019-09-05 DIAGNOSIS — R030 Elevated blood-pressure reading, without diagnosis of hypertension: Secondary | ICD-10-CM

## 2019-09-18 ENCOUNTER — Other Ambulatory Visit: Payer: Self-pay

## 2019-09-18 ENCOUNTER — Encounter: Payer: Self-pay | Admitting: Nurse Practitioner

## 2019-09-18 ENCOUNTER — Ambulatory Visit: Payer: No Typology Code available for payment source | Admitting: Nurse Practitioner

## 2019-09-18 VITALS — BP 124/82 | HR 72 | Temp 98.4°F | Ht 71.8 in | Wt 296.0 lb

## 2019-09-18 DIAGNOSIS — Z1159 Encounter for screening for other viral diseases: Secondary | ICD-10-CM | POA: Diagnosis not present

## 2019-09-18 DIAGNOSIS — Z6841 Body Mass Index (BMI) 40.0 and over, adult: Secondary | ICD-10-CM

## 2019-09-18 DIAGNOSIS — R7303 Prediabetes: Secondary | ICD-10-CM

## 2019-09-18 DIAGNOSIS — I10 Essential (primary) hypertension: Secondary | ICD-10-CM

## 2019-09-18 DIAGNOSIS — E782 Mixed hyperlipidemia: Secondary | ICD-10-CM

## 2019-09-18 DIAGNOSIS — Z111 Encounter for screening for respiratory tuberculosis: Secondary | ICD-10-CM

## 2019-09-18 NOTE — Progress Notes (Signed)
This visit occurred during the SARS-CoV-2 public health emergency.  Safety protocols were in place, including screening questions prior to the visit, additional usage of staff PPE, and extensive cleaning of exam room while observing appropriate contact time as indicated for disinfecting solutions.  Subjective:     Patient ID: Grant Hernandez , male    DOB: [birth year] , 44 y.o.   MRN: [id number]   Chief Complaint  Patient presents with  . Hypertension  . Prediabetes  . Hyperlipidemia    HPI  Walking 3 miles a day.  He is not eating fried food. Eating more fruit. He will eat sweet potato mainly, decreased his intake of bread.  Wt Readings from Last 3 Encounters: 09/18/19 : 296 lb (134.3 kg) 05/24/19 : 287 lb (130.2 kg) 11/30/18 : 288 lb 12.8 oz (131 kg)  He does report he had Covid last year did fairly well.     Hypertension This is a chronic problem. The current episode started more than 1 year ago. The problem is unchanged. The problem is controlled. Pertinent negatives include no anxiety, chest pain, headaches or palpitations. There are no associated agents to hypertension. Risk factors for coronary artery disease include obesity, sedentary lifestyle and male gender. Past treatments include ACE inhibitors. The current treatment provides significant improvement. There are no compliance problems.  There is no history of angina. There is no history of chronic renal disease.  Hyperlipidemia This is a chronic problem. The problem is controlled. Recent lipid tests were reviewed and are low. He has no history of chronic renal disease. There are no known factors aggravating his hyperlipidemia. Pertinent negatives include no chest pain. Current antihyperlipidemic treatment includes diet change and exercise. There are no compliance problems.  Risk factors for coronary artery disease include male sex, obesity and a sedentary lifestyle.     Past Medical History:  Diagnosis Date  . 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 Hypertension   . MRSA (methicillin resistant Staphylococcus aureus) 2010   right axilla  . Myasthenia gravis ([place])   . Pectoralis muscle rupture      Family History  Problem Relation Age of Onset  . Healthy Mother   . Other Father        unknown     Current Outpatient Medications:  .  Cholecalciferol (VITAMIN D3) 1.25 MG (50000 UT) CAPS, TAKE ONE (1) CAPSULE BY MOUTH ON TUESDAY AND FRIDAY, Disp: 24 capsule, Rfl: 0 .  lisinopril (ZESTRIL) 2.5 MG tablet, TAKE ONE (1) TABLET (2.5 MG TOTAL) BY MOUTH DAILY., Disp: 90 tablet, Rfl: 0 .  mycophenolate (CELLCEPT) 250 MG capsule, Take by mouth., Disp: , Rfl:  .  mycophenolate (CELLCEPT) 500 MG tablet, Take 1,000 mg by mouth. , Disp: , Rfl:  .  omeprazole (PRILOSEC) 40 MG capsule, , Disp: , Rfl:  .  Pyridostigmine Bromide (MESTINON PO), Take 60 mg by mouth 2 (two) times daily. , Disp: , Rfl:    No Known Allergies   Review of Systems  Constitutional: Negative.  Negative for fatigue.  Respiratory: Negative.   Cardiovascular: Negative.  Negative for chest pain, palpitations and leg swelling.  Neurological: Negative.  Negative for dizziness and headaches.  Psychiatric/Behavioral: Negative.      Today's Vitals   09/18/19 1105  BP: 124/82  Pulse: 72  Temp: 98.4 F (36.9 C)  TempSrc: Oral  Weight: 296 lb (134.3 kg)  Height: 5' 11.8" (  1.824 m)  PainSc: 0-No pain   Body mass index is 40.37 kg/m.   Objective:  Physical Exam Vitals reviewed.  Constitutional:      General: He is not in acute distress.    Appearance: Normal appearance. He is obese.  Cardiovascular:     Rate and Rhythm: Normal rate and regular rhythm.     Pulses: Normal pulses.     Heart sounds: Normal heart sounds. No murmur heard.   Pulmonary:     Effort: No respiratory distress.     Breath sounds: Normal breath sounds.  Musculoskeletal:        General:  No swelling.  Skin:    General: Skin is warm and dry.     Capillary Refill: Capillary refill takes less than 2 seconds.  Neurological:     General: No focal deficit present.     Mental Status: He is alert and oriented to person, place, and time.     Cranial Nerves: No cranial nerve deficit.  Psychiatric:        Mood and Affect: Mood normal.        Behavior: Behavior normal.        Thought Content: Thought content normal.        Judgment: Judgment normal.         Assessment And Plan:     1. Mixed hyperlipidemia  Chronic, controlled  No current medications, diet controlled  - CMP14+EGFR - Lipid panel  2. Prediabetes  Chronic, stable  Encouraged to continue with physical activity and may need to change up the exercise routine as tolerated. - Hemoglobin A1c - CMP14+EGFR  3. Essential hypertension  Chronic, good control  Continue with current medications - CMP14+EGFR  4. Encounter for hepatitis C screening test for low risk patient  Will check Hepatitis C screening due to recent recommendations to screen all adults 18 years and older - Hepatitis C antibody  5. Encounter for screening for respiratory tuberculosis  Requesting for employment - [employer]  6. Class 3 severe obesity due to excess calories without serious comorbidity with body mass index (BMI) of 40.0 to 44.9 in adult [facility])  Discussed his recent weight gain and encouraged to change up his exercise routine to burn more calories  Stay well hydrated with water       [name], FNP   I, [name], FNP, have reviewed all documentation for this visit. The documentation on 09/18/19 for the exam, diagnosis, procedures, and orders are all accurate and complete.  THE PATIENT IS ENCOURAGED TO PRACTICE SOCIAL DISTANCING DUE TO THE COVID-19 PANDEMIC.

## 2019-09-20 LAB — CMP14+EGFR
ALT: 26 IU/L (ref 0–44)
AST: 23 IU/L (ref 0–40)
Albumin/Globulin Ratio: 1.4 (ref 1.2–2.2)
Albumin: 4.5 g/dL (ref 4.0–5.0)
Alkaline Phosphatase: 62 IU/L (ref 48–121)
BUN/Creatinine Ratio: 12 (ref 9–20)
BUN: 16 mg/dL (ref 6–24)
Bilirubin Total: 0.5 mg/dL (ref 0.0–1.2)
CO2: 24 mmol/L (ref 20–29)
Calcium: 10.2 mg/dL (ref 8.7–10.2)
Chloride: 105 mmol/L (ref 96–106)
Creatinine, Ser: 1.29 mg/dL — ABNORMAL HIGH (ref 0.76–1.27)
GFR calc Af Amer: 77 mL/min/{1.73_m2} (ref >59)
GFR calc non Af Amer: 67 mL/min/{1.73_m2} (ref >59)
Globulin, Total: 3.2 g/dL (ref 1.5–4.5)
Glucose: 69 mg/dL (ref 65–99)
Potassium: 4.3 mmol/L (ref 3.5–5.2)
Sodium: 142 mmol/L (ref 134–144)
Total Protein: 7.7 g/dL (ref 6.0–8.5)

## 2019-09-20 LAB — HEMOGLOBIN A1C
Est. average glucose Bld gHb Est-mCnc: 128 mg/dL
Hgb A1c MFr Bld: 6.1 % — ABNORMAL HIGH (ref 4.8–5.6)

## 2019-09-20 LAB — LIPID PANEL
Chol/HDL Ratio: 4.7 ratio (ref 0.0–5.0)
Cholesterol, Total: 183 mg/dL (ref 100–199)
HDL: 39 mg/dL — ABNORMAL LOW (ref >39)
LDL Chol Calc (NIH): 123 mg/dL — ABNORMAL HIGH (ref 0–99)
Triglycerides: 115 mg/dL (ref 0–149)
VLDL Cholesterol Cal: 21 mg/dL (ref 5–40)

## 2019-09-20 LAB — QUANTIFERON-TB GOLD PLUS
QuantiFERON Mitogen Value: >10 IU/mL
QuantiFERON Nil Value: 0 IU/mL
QuantiFERON TB1 Ag Value: 0.01 IU/mL
QuantiFERON TB2 Ag Value: 0 IU/mL
QuantiFERON-TB Gold Plus: NEGATIVE

## 2019-09-20 LAB — HEPATITIS C ANTIBODY: Hep C Virus Ab: <0.1 s/co ratio (ref 0.0–0.9)

## 2019-12-06 ENCOUNTER — Encounter: Payer: No Typology Code available for payment source | Admitting: Nurse Practitioner

## 2019-12-06 ENCOUNTER — Encounter: Payer: No Typology Code available for payment source | Admitting: Internal Medicine

## 2019-12-06 ENCOUNTER — Other Ambulatory Visit: Payer: Self-pay | Admitting: Internal Medicine

## 2019-12-06 ENCOUNTER — Other Ambulatory Visit: Payer: Self-pay | Admitting: Nurse Practitioner

## 2019-12-06 DIAGNOSIS — R7989 Other specified abnormal findings of blood chemistry: Secondary | ICD-10-CM

## 2019-12-06 DIAGNOSIS — R030 Elevated blood-pressure reading, without diagnosis of hypertension: Secondary | ICD-10-CM

## 2019-12-06 MED ORDER — VITAMIN D3 1.25 MG (50000 UT) PO CAPS: ORAL_CAPSULE | ORAL | 0 refills | Status: DC

## 2019-12-06 MED FILL — VITAMIN D3 50,000 UNITS CAP: 1.25 MG | 84 days supply | Qty: 24 | Fill #0

## 2019-12-07 MED ORDER — LISINOPRIL 2.5 MG PO TABS: 2.5000 mg | ORAL_TABLET | Freq: Every day | ORAL | 0 refills | Status: DC

## 2019-12-10 ENCOUNTER — Other Ambulatory Visit: Payer: Self-pay | Admitting: Nurse Practitioner

## 2019-12-10 DIAGNOSIS — R030 Elevated blood-pressure reading, without diagnosis of hypertension: Secondary | ICD-10-CM

## 2019-12-10 MED ORDER — LISINOPRIL 2.5 MG PO TABS: 2.5000 mg | ORAL_TABLET | Freq: Every day | ORAL | 0 refills | Status: DC

## 2019-12-10 MED FILL — LISINOPRIL 2.5 MG TABLET: 2.5 | 90 days supply | Qty: 90 | Fill #0

## 2019-12-13 ENCOUNTER — Ambulatory Visit (INDEPENDENT_AMBULATORY_CARE_PROVIDER_SITE_OTHER): Payer: No Typology Code available for payment source | Admitting: Nurse Practitioner

## 2019-12-13 ENCOUNTER — Encounter: Payer: Self-pay | Admitting: Nurse Practitioner

## 2019-12-13 ENCOUNTER — Other Ambulatory Visit: Payer: Self-pay

## 2019-12-13 VITALS — BP 124/80 | HR 77 | Temp 97.9°F | Ht 70.0 in | Wt 297.2 lb

## 2019-12-13 DIAGNOSIS — Z0000 Encounter for general adult medical examination without abnormal findings: Secondary | ICD-10-CM

## 2019-12-13 DIAGNOSIS — Z23 Encounter for immunization: Secondary | ICD-10-CM | POA: Diagnosis not present

## 2019-12-13 DIAGNOSIS — R7303 Prediabetes: Secondary | ICD-10-CM

## 2019-12-13 DIAGNOSIS — E782 Mixed hyperlipidemia: Secondary | ICD-10-CM

## 2019-12-13 DIAGNOSIS — Z0001 Encounter for general adult medical examination with abnormal findings: Secondary | ICD-10-CM

## 2019-12-13 DIAGNOSIS — R030 Elevated blood-pressure reading, without diagnosis of hypertension: Secondary | ICD-10-CM

## 2019-12-13 DIAGNOSIS — R7989 Other specified abnormal findings of blood chemistry: Secondary | ICD-10-CM | POA: Diagnosis not present

## 2019-12-13 DIAGNOSIS — Z6841 Body Mass Index (BMI) 40.0 and over, adult: Secondary | ICD-10-CM

## 2019-12-13 DIAGNOSIS — G7000 Myasthenia gravis without (acute) exacerbation: Secondary | ICD-10-CM

## 2019-12-13 DIAGNOSIS — Z125 Encounter for screening for malignant neoplasm of prostate: Secondary | ICD-10-CM

## 2019-12-13 LAB — POCT URINALYSIS DIPSTICK
Bilirubin, UA: NEGATIVE
Blood, UA: NEGATIVE
Glucose, UA: NEGATIVE
Ketones, UA: NEGATIVE
Leukocytes, UA: NEGATIVE
Nitrite, UA: NEGATIVE
Protein, UA: NEGATIVE
Spec Grav, UA: 1.02
Urobilinogen, UA: 1 U/dL
pH, UA: 8.5 — AB

## 2019-12-13 MED ORDER — OMEGA-3-ACID ETHYL ESTERS 1 G PO CAPS
1.0000 g | ORAL_CAPSULE | Freq: Two times a day (BID) | ORAL | 1 refills | Status: DC
Start: 2019-12-13 — End: 2020-07-31

## 2019-12-13 MED FILL — OMEGA-3-ACID ETHYL ESTERS 1: 1 | 90 days supply | Qty: 180 | Fill #0

## 2019-12-13 NOTE — Progress Notes (Signed)
I,Yamilka Roman [employer] as a Education administrator for [employer], FNP.,have documented all relevant documentation on the behalf of [name], FNP,as directed by  [name], FNP while in the presence of [name], [place].  This visit occurred during the SARS-CoV-2 public health emergency.  Safety protocols were in place, including screening questions prior to the visit, additional usage of staff PPE, and extensive cleaning of exam room while observing appropriate contact time as indicated for disinfecting solutions.  Subjective:     Patient ID: Grant Hernandez , male    DOB: [birth year] , 44 y.o.   MRN: [id number]   Chief Complaint  Patient presents with  . Annual Exam    HPI  Patient here for HM.  Wt Readings from Last 3 Encounters: 12/13/19 : 297 lb 3.2 oz (134.8 kg) 09/18/19 : 296 lb (134.3 kg) 05/24/19 : 287 lb (130.2 kg)   Hypertension This is a chronic problem. The current episode started more than 1 year ago. The problem is unchanged. The problem is controlled. Pertinent negatives include no anxiety, blurred vision, chest pain or palpitations. There are no associated agents to hypertension. Risk factors for coronary artery disease include obesity.     Past Medical History:  Diagnosis Date  . 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 Hypertension   . MRSA (methicillin resistant Staphylococcus aureus) 2010   right axilla  . Myasthenia gravis ([place])   . Pectoralis muscle rupture      Family History  Problem Relation Age of Onset  . Healthy Mother   . Other Father        unknown     Current Outpatient Medications:  .  Cholecalciferol (VITAMIN D3) 1.25 MG (50000 UT) CAPS, TAKE ONE (1) CAPSULE BY MOUTH ON TUESDAY AND FRIDAY, Disp: 24 capsule, Rfl: 0 .  mycophenolate (CELLCEPT) 250 MG capsule, Take by mouth., Disp: , Rfl:  .  mycophenolate (CELLCEPT) 500 MG tablet, Take 1,000 mg by mouth. ,  Disp: , Rfl:  .  omeprazole (PRILOSEC) 40 MG capsule, 40 mg as needed. , Disp: , Rfl:  .  Pyridostigmine Bromide (MESTINON PO), Take 60 mg by mouth 2 (two) times daily. , Disp: , Rfl:  .  lisinopril (ZESTRIL) 2.5 MG tablet, Take 1 tablet (2.5 mg total) by mouth daily., Disp: 90 tablet, Rfl: 1 .  metFORMIN (GLUCOPHAGE) 500 MG tablet, Take 1 tablet (500 mg total) by mouth daily with breakfast., Disp: 90 tablet, Rfl: 1 .  nystatin-triamcinolone ointment (MYCOLOG), Apply 1 application topically 2 (two) times daily., Disp: 30 g, Rfl: 2 .  omega-3 acid ethyl esters (LOVAZA) 1 g capsule, Take 1 capsule (1 g total) by mouth 2 (two) times daily. (Patient not taking: Reported on 02/19/2020), Disp: 180 capsule, Rfl: 1   No Known Allergies   Men's preventive visit. Patient Health Questionnaire (PHQ-2) is  [place] Office Visit from 12/13/2019 in [facility]  PHQ-2 Total Score 0     Patient is on a Regular diet, tries to avoid fried foods.. Marital status: Married. Relevant history for alcohol use is:  Social History   Substance and Sexual Activity  Alcohol Use Yes   Comment: social   Relevant history for tobacco use is:  Social History   Tobacco Use  Smoking Status Never Smoker  Smokeless Tobacco Never Used  .   Review of Systems  Constitutional: Negative.   HENT: Negative.  Eyes: Negative.  Negative for blurred vision.  Respiratory: Negative.   Cardiovascular: Negative.  Negative for chest pain, palpitations and leg swelling.  Gastrointestinal: Negative.   Endocrine: Negative.   Genitourinary: Negative.   Musculoskeletal: Negative.   Skin: Negative.   Neurological: Negative.   Hematological: Negative.   Psychiatric/Behavioral: Negative.      Today's Vitals   12/13/19 1457  BP: 124/80  Pulse: 77  Temp: 97.9 F (36.6 C)  TempSrc: Oral  Weight: 297 lb 3.2 oz (134.8 kg)  Height: 5\' 10"  (1.778 m)  PainSc: 0-No pain   Body mass index is 42.64 kg/m.    Objective:  Physical Exam Vitals reviewed.  Constitutional:      General: He is not in acute distress.    Appearance: Normal appearance. He is obese.  HENT:     Head: Normocephalic and atraumatic.     Right Ear: Tympanic membrane, ear canal and external ear normal. There is no impacted cerumen.     Left Ear: Tympanic membrane, ear canal and external ear normal. There is no impacted cerumen.     Nose:     Comments: Deferred - masked     Mouth/Throat:     Comments: Deferred - masked  Cardiovascular:     Rate and Rhythm: Normal rate and regular rhythm.     Pulses: Normal pulses.     Heart sounds: Normal heart sounds. No murmur heard.   Pulmonary:     Effort: Pulmonary effort is normal. No respiratory distress.     Breath sounds: Normal breath sounds. No wheezing.  Abdominal:     General: Abdomen is flat. Bowel sounds are normal. There is no distension.     Palpations: Abdomen is soft.  Musculoskeletal:        General: Normal range of motion.     Cervical back: Normal range of motion and neck supple.  Skin:    General: Skin is warm.     Capillary Refill: Capillary refill takes less than 2 seconds.  Neurological:     General: No focal deficit present.     Mental Status: He is alert and oriented to person, place, and time.  Psychiatric:        Mood and Affect: Mood normal.        Behavior: Behavior normal.        Thought Content: Thought content normal.        Judgment: Judgment normal.         Assessment And Plan:    1. Encounter for general adult medical examination w/o abnormal findings . Behavior modifications discussed and diet history reviewed.   . Pt will continue to exercise regularly and modify diet with low GI, plant based foods and decrease intake of processed foods.  . Recommend intake of daily multivitamin, Vitamin D, and calcium.  . Recommend for preventive screenings, as well as recommend immunizations that include influenza, TDAP ( up to date)  2.  Encounter for prostate cancer screening - PSA  3. Need for influenza vaccination  Influenza vaccine administered  Encouraged to take Tylenol as needed for fever or muscle aches. - Flu Vaccine QUAD 6+ mos PF IM (Fluarix Quad PF)  4. Class 3 severe obesity with body mass index (BMI) of 40.0 to 44.9 in adult, unspecified obesity type, unspecified whether serious comorbidity present (HCC)  Chronic  Discussed healthy diet and regular exercise options   Encouraged to exercise at least 150 minutes per week with 2 days of strength  training  5. essential hypertension  Chronic, blood pressure is well controlled  Discussed how weight loss can improve blood pressure - Hemoglobin A1c - POCT Urinalysis Dipstick (81002) - POCT UA - Microalbumin - EKG 12-Lead  6. Low vitamin D level  Will check vitamin D level and supplement as needed.     Also encouraged to spend 15 minutes in the sun daily.   7. Mixed hyperlipidemia  Slightly elevated cholesterol levels   Encouraged to avoid fried and fatty foods.   8. Prediabetes  No current medications at this time pending lab results will start medications  9. Myasthenia gravis ([place])  Chronic, continues follow up at [facility]   No recent exacerbations      Patient was given opportunity to ask questions. Patient verbalized understanding of the plan and was able to repeat key elements of the plan. All questions were answered to their satisfaction.   [name], FNP   I, [name], FNP, have reviewed all documentation for this visit. The documentation on 03/03/20 for the exam, diagnosis, procedures, and orders are all accurate and complete.  THE PATIENT IS ENCOURAGED TO PRACTICE SOCIAL DISTANCING DUE TO THE COVID-19 PANDEMIC.

## 2019-12-13 NOTE — Patient Instructions (Signed)

## 2019-12-14 LAB — HEMOGLOBIN A1C
Est. average glucose Bld gHb Est-mCnc: 140 mg/dL
Hgb A1c MFr Bld: 6.5 % — ABNORMAL HIGH (ref 4.8–5.6)

## 2019-12-14 LAB — PSA: Prostate Specific Ag, Serum: 0.6 ng/mL (ref 0.0–4.0)

## 2019-12-25 ENCOUNTER — Other Ambulatory Visit: Payer: Self-pay | Admitting: Nurse Practitioner

## 2019-12-25 DIAGNOSIS — R7309 Other abnormal glucose: Secondary | ICD-10-CM

## 2019-12-25 MED ORDER — METFORMIN HCL 500 MG PO TABS: 500.0000 mg | ORAL_TABLET | Freq: Every day | ORAL | 2 refills | Status: DC

## 2019-12-25 MED FILL — METFORMIN HCL 500 MG TABS: 500 | 30 days supply | Qty: 30 | Fill #0

## 2019-12-26 ENCOUNTER — Other Ambulatory Visit (HOSPITAL_COMMUNITY): Payer: Self-pay | Admitting: Psychiatry

## 2019-12-26 MED FILL — PYRIDOSTIGMINE BR 60 MG TAB: 60 | 90 days supply | Qty: 405 | Fill #0

## 2020-02-07 MED FILL — METFORMIN HCL 500 MG TABS: 500 | 30 days supply | Qty: 30 | Fill #1

## 2020-02-19 ENCOUNTER — Ambulatory Visit (INDEPENDENT_AMBULATORY_CARE_PROVIDER_SITE_OTHER): Payer: No Typology Code available for payment source | Admitting: Nurse Practitioner

## 2020-02-19 ENCOUNTER — Other Ambulatory Visit: Payer: Self-pay | Admitting: Nurse Practitioner

## 2020-02-19 ENCOUNTER — Other Ambulatory Visit: Payer: Self-pay

## 2020-02-19 ENCOUNTER — Encounter: Payer: Self-pay | Admitting: Nurse Practitioner

## 2020-02-19 VITALS — BP 124/80 | HR 63 | Temp 98.2°F | Ht 70.0 in | Wt 285.8 lb

## 2020-02-19 DIAGNOSIS — E782 Mixed hyperlipidemia: Secondary | ICD-10-CM | POA: Diagnosis not present

## 2020-02-19 DIAGNOSIS — R21 Rash and other nonspecific skin eruption: Secondary | ICD-10-CM

## 2020-02-19 DIAGNOSIS — R030 Elevated blood-pressure reading, without diagnosis of hypertension: Secondary | ICD-10-CM | POA: Diagnosis not present

## 2020-02-19 DIAGNOSIS — Z6841 Body Mass Index (BMI) 40.0 and over, adult: Secondary | ICD-10-CM

## 2020-02-19 DIAGNOSIS — R7303 Prediabetes: Secondary | ICD-10-CM

## 2020-02-19 MED ORDER — NYSTATIN-TRIAMCINOLONE 100000-0.1 UNIT/GM-% EX OINT: 1.0000 "application " | TOPICAL_OINTMENT | Freq: Two times a day (BID) | CUTANEOUS | 2 refills | Status: DC

## 2020-02-19 MED ORDER — LISINOPRIL 2.5 MG PO TABS: 2.5000 mg | ORAL_TABLET | Freq: Every day | ORAL | 1 refills | Status: DC

## 2020-02-19 MED ORDER — METFORMIN HCL 500 MG PO TABS: 500.0000 mg | ORAL_TABLET | Freq: Every day | ORAL | 1 refills | Status: DC

## 2020-02-19 MED FILL — NYSTATIN-TRIAMCINOLONE 1000: 100000-0.1 | 14 days supply | Qty: 30 | Fill #0

## 2020-02-19 NOTE — Progress Notes (Signed)
I,Yamilka [employer] as a Education administrator for [employer], FNP.,have documented all relevant documentation on the behalf of [name], FNP,as directed by  [name], FNP while in the presence of [name], [place]. This visit occurred during the SARS-CoV-2 public health emergency.  Safety protocols were in place, including screening questions prior to the visit, additional usage of staff PPE, and extensive cleaning of exam room while observing appropriate contact time as indicated for disinfecting solutions.  Subjective:     Patient ID: Grant Hernandez , male    DOB: [birth year] , 44 y.o.   MRN: [id number]   Chief Complaint  Patient presents with  . Prediabetes    HPI  Patient here for a f/u on his prediabetes. He started metformin at his last visit. He reports he is tolerating well.  He is exercising 4 times a week.  He has changed his diet and cut back on bread, fried food.  He states "all I drink is water"  Approximately 60 ounces daily.    Wt Readings from Last 3 Encounters: 02/19/20 : 285 lb 12.8 oz (129.6 kg) 12/13/19 : 297 lb 3.2 oz (134.8 kg) 09/18/19 : 296 lb (134.3 kg)    Past Medical History:  Diagnosis Date  . 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 Hypertension   . MRSA (methicillin resistant Staphylococcus aureus) 2010   right axilla  . Myasthenia gravis ([place])   . Pectoralis muscle rupture      Family History  Problem Relation Age of Onset  . Healthy Mother   . Other Father        unknown     Current Outpatient Medications:  .  Cholecalciferol (VITAMIN D3) 1.25 MG (50000 UT) CAPS, TAKE ONE (1) CAPSULE BY MOUTH ON TUESDAY AND FRIDAY, Disp: 24 capsule, Rfl: 0 .  mycophenolate (CELLCEPT) 250 MG capsule, Take by mouth., Disp: , Rfl:  .  mycophenolate (CELLCEPT) 500 MG tablet, Take 1,000 mg by mouth. , Disp: , Rfl:  .  omeprazole (PRILOSEC) 40 MG capsule, 40 mg as needed. , Disp:  , Rfl:  .  Pyridostigmine Bromide (MESTINON PO), Take 60 mg by mouth 2 (two) times daily. , Disp: , Rfl:  .  lisinopril (ZESTRIL) 2.5 MG tablet, Take 1 tablet (2.5 mg total) by mouth daily., Disp: 90 tablet, Rfl: 1 .  metFORMIN (GLUCOPHAGE) 500 MG tablet, Take 1 tablet (500 mg total) by mouth daily with breakfast., Disp: 90 tablet, Rfl: 1 .  nystatin-triamcinolone ointment (MYCOLOG), Apply 1 application topically 2 (two) times daily., Disp: 30 g, Rfl: 2 .  omega-3 acid ethyl esters (LOVAZA) 1 g capsule, Take 1 capsule (1 g total) by mouth 2 (two) times daily. (Patient not taking: Reported on 02/19/2020), Disp: 180 capsule, Rfl: 1   No Known Allergies   Review of Systems  Constitutional: Negative.  Negative for fatigue.  Eyes: Negative.   Respiratory: Negative.   Cardiovascular: Negative.   Gastrointestinal: Negative.   Endocrine: Negative for polydipsia, polyphagia and polyuria.  Musculoskeletal: Negative.   Skin: Negative.   Psychiatric/Behavioral: Negative.      Today's Vitals   02/19/20 0853  BP: 124/80  Pulse: 63  Temp: 98.2 F (36.8 C)  TempSrc: Oral  Weight: 285 lb 12.8 oz (129.6 kg)  Height: _0  (1.778 m)  PainSc: 0-No pain   Body mass index is 41.01 kg/m.   Objective:  Physical Exam Vitals reviewed.  Constitutional:      General: He is not in acute distress.    Appearance: Normal appearance.  Cardiovascular:     Rate and Rhythm: Normal rate and regular rhythm.     Pulses: Normal pulses.     Heart sounds: Normal heart sounds. No murmur heard.   Pulmonary:     Effort: Pulmonary effort is normal. No respiratory distress.     Breath sounds: Normal breath sounds.  Skin:    General: Skin is warm.     Findings: Rash (scaly dry patch to right inner buttocks) present.  Neurological:     General: No focal deficit present.     Mental Status: He is alert and oriented to person, place, and time.     Cranial Nerves: No cranial nerve deficit.  Psychiatric:         Mood and Affect: Mood normal.        Behavior: Behavior normal.        Thought Content: Thought content normal.        Judgment: Judgment normal.         Assessment And Plan:     1. Prediabetes  Chronic, his HgbA1c had increased at his last visit  He is tolerating metformin well  Will check HgbA1c since 8 weeks and will check kidney functions - BMP8+eGFR - Hemoglobin A1c - metFORMIN (GLUCOPHAGE) 500 MG tablet; Take 1 tablet (500 mg total) by mouth daily with breakfast.  Dispense: 90 tablet; Refill: 1  2. essential hypertension  Chronic, good control  Continue with current medications - lisinopril (ZESTRIL) 2.5 MG tablet; Take 1 tablet (2.5 mg total) by mouth daily.  Dispense: 90 tablet; Refill: 1  3. Mixed hyperlipidemia  Chronic, her good cholesterol was slightly declined however he has been exercising and this may have improved will check today - Lipid panel  4. Rash and nonspecific skin eruption  Dry, scaly and patchy rash to inner buttock on right, will treat with nystatin/triamcinolone cream - nystatin-triamcinolone ointment (MYCOLOG); Apply 1 application topically 2 (two) times daily.  Dispense: 30 g; Refill: 2  5. Class 3 severe obesity with body mass index (BMI) of 40.0 to 44.9 in adult, unspecified obesity type, unspecified whether serious comorbidity present [facility])  He has lost 12 lbs since his last office visit     Patient was given opportunity to ask questions. Patient verbalized understanding of the plan and was able to repeat key elements of the plan. All questions were answered to their satisfaction.   Teola Bradley, FNP, have reviewed all documentation for this visit. The documentation on 02/19/20 for the exam, diagnosis, procedures, and orders are all accurate and complete.  THE PATIENT IS ENCOURAGED TO PRACTICE SOCIAL DISTANCING DUE TO THE COVID-19 PANDEMIC.

## 2020-02-20 LAB — BMP8+EGFR
BUN/Creatinine Ratio: 14 (ref 9–20)
BUN: 17 mg/dL (ref 6–24)
CO2: 23 mmol/L (ref 20–29)
Calcium: 9.9 mg/dL (ref 8.7–10.2)
Chloride: 105 mmol/L (ref 96–106)
Creatinine, Ser: 1.22 mg/dL (ref 0.76–1.27)
GFR calc Af Amer: 82 mL/min/{1.73_m2} (ref >59)
GFR calc non Af Amer: 71 mL/min/{1.73_m2} (ref >59)
Glucose: 86 mg/dL (ref 65–99)
Potassium: 4.7 mmol/L (ref 3.5–5.2)
Sodium: 142 mmol/L (ref 134–144)

## 2020-02-20 LAB — LIPID PANEL
Chol/HDL Ratio: 3.7 ratio (ref 0.0–5.0)
Cholesterol, Total: 146 mg/dL (ref 100–199)
HDL: 39 mg/dL — ABNORMAL LOW (ref >39)
LDL Chol Calc (NIH): 94 mg/dL (ref 0–99)
Triglycerides: 63 mg/dL (ref 0–149)
VLDL Cholesterol Cal: 13 mg/dL (ref 5–40)

## 2020-02-20 LAB — HEMOGLOBIN A1C
Est. average glucose Bld gHb Est-mCnc: 131 mg/dL
Hgb A1c MFr Bld: 6.2 % — ABNORMAL HIGH (ref 4.8–5.6)

## 2020-03-03 ENCOUNTER — Encounter: Payer: Self-pay | Admitting: Nurse Practitioner

## 2020-03-10 ENCOUNTER — Other Ambulatory Visit: Payer: Self-pay | Admitting: Nurse Practitioner

## 2020-03-10 DIAGNOSIS — R7989 Other specified abnormal findings of blood chemistry: Secondary | ICD-10-CM

## 2020-03-10 MED FILL — METFORMIN HCL 500 MG TABS: 500 | 30 days supply | Qty: 30 | Fill #2

## 2020-03-10 MED FILL — LISINOPRIL 2.5 MG TABLET: 2.5 | 90 days supply | Qty: 90 | Fill #0

## 2020-03-11 ENCOUNTER — Other Ambulatory Visit: Payer: Self-pay

## 2020-03-11 ENCOUNTER — Other Ambulatory Visit: Payer: Self-pay | Admitting: Nurse Practitioner

## 2020-03-11 DIAGNOSIS — R030 Elevated blood-pressure reading, without diagnosis of hypertension: Secondary | ICD-10-CM

## 2020-03-11 MED ORDER — LISINOPRIL 2.5 MG PO TABS: 2.5000 mg | ORAL_TABLET | Freq: Every day | ORAL | 1 refills | Status: DC

## 2020-03-11 MED ORDER — VITAMIN D3 1.25 MG (50000 UT) PO CAPS: ORAL_CAPSULE | ORAL | 0 refills | Status: DC

## 2020-03-11 MED FILL — VITAMIN D3 50,000 UNITS CAP: 1.25 MG | 84 days supply | Qty: 24 | Fill #0

## 2020-04-08 MED FILL — METFORMIN HCL 500 MG TABS: 500 | 90 days supply | Qty: 90 | Fill #0

## 2020-04-14 ENCOUNTER — Other Ambulatory Visit: Payer: Self-pay | Admitting: Nurse Practitioner

## 2020-04-14 ENCOUNTER — Other Ambulatory Visit: Payer: Self-pay

## 2020-04-14 DIAGNOSIS — R7303 Prediabetes: Secondary | ICD-10-CM

## 2020-04-14 MED ORDER — METFORMIN HCL 500 MG PO TABS: 500.0000 mg | ORAL_TABLET | Freq: Every day | ORAL | 1 refills | Status: DC

## 2020-06-08 ENCOUNTER — Other Ambulatory Visit: Payer: Self-pay

## 2020-06-13 ENCOUNTER — Other Ambulatory Visit (HOSPITAL_BASED_OUTPATIENT_CLINIC_OR_DEPARTMENT_OTHER): Payer: Self-pay

## 2020-06-13 MED ORDER — PYRIDOSTIGMINE BROMIDE 60 MG PO TABS
ORAL_TABLET | ORAL | 3 refills | Status: DC
  Filled 2020-06-13: qty 405, 90d supply, fill #0

## 2020-06-16 ENCOUNTER — Other Ambulatory Visit (HOSPITAL_BASED_OUTPATIENT_CLINIC_OR_DEPARTMENT_OTHER): Payer: Self-pay

## 2020-06-18 MED FILL — Metformin HCl Tab 500 MG: ORAL | 90 days supply | Qty: 90 | Fill #0 | Status: AC

## 2020-06-19 ENCOUNTER — Other Ambulatory Visit (HOSPITAL_BASED_OUTPATIENT_CLINIC_OR_DEPARTMENT_OTHER): Payer: Self-pay

## 2020-06-26 ENCOUNTER — Ambulatory Visit: Payer: No Typology Code available for payment source | Admitting: Nurse Practitioner

## 2020-06-26 ENCOUNTER — Other Ambulatory Visit (HOSPITAL_BASED_OUTPATIENT_CLINIC_OR_DEPARTMENT_OTHER): Payer: Self-pay

## 2020-06-26 MED ORDER — MYCOPHENOLATE MOFETIL 500 MG PO TABS
ORAL_TABLET | ORAL | 3 refills | Status: DC
  Filled 2020-06-26: qty 360, 90d supply, fill #0

## 2020-06-26 MED ORDER — MYCOPHENOLATE MOFETIL 250 MG PO CAPS
ORAL_CAPSULE | ORAL | 3 refills | Status: DC
  Filled 2020-06-26: qty 180, 90d supply, fill #0

## 2020-06-26 MED ORDER — PREDNISONE 2.5 MG PO TABS
ORAL_TABLET | ORAL | 3 refills | Status: DC
  Filled 2020-06-26: qty 108, 84d supply, fill #0

## 2020-06-27 ENCOUNTER — Other Ambulatory Visit (HOSPITAL_BASED_OUTPATIENT_CLINIC_OR_DEPARTMENT_OTHER): Payer: Self-pay

## 2020-07-01 ENCOUNTER — Other Ambulatory Visit (HOSPITAL_BASED_OUTPATIENT_CLINIC_OR_DEPARTMENT_OTHER): Payer: Self-pay

## 2020-07-24 ENCOUNTER — Ambulatory Visit: Payer: No Typology Code available for payment source | Admitting: Nurse Practitioner

## 2020-07-31 ENCOUNTER — Ambulatory Visit (INDEPENDENT_AMBULATORY_CARE_PROVIDER_SITE_OTHER): Payer: No Typology Code available for payment source | Admitting: Nurse Practitioner

## 2020-07-31 ENCOUNTER — Other Ambulatory Visit (HOSPITAL_BASED_OUTPATIENT_CLINIC_OR_DEPARTMENT_OTHER): Payer: Self-pay

## 2020-07-31 ENCOUNTER — Encounter: Payer: Self-pay | Admitting: Nurse Practitioner

## 2020-07-31 ENCOUNTER — Other Ambulatory Visit: Payer: Self-pay

## 2020-07-31 VITALS — BP 126/78 | HR 68 | Temp 98.2°F | Ht 70.0 in | Wt 298.0 lb

## 2020-07-31 DIAGNOSIS — R6882 Decreased libido: Secondary | ICD-10-CM

## 2020-07-31 DIAGNOSIS — G7000 Myasthenia gravis without (acute) exacerbation: Secondary | ICD-10-CM

## 2020-07-31 DIAGNOSIS — R7303 Prediabetes: Secondary | ICD-10-CM

## 2020-07-31 DIAGNOSIS — R030 Elevated blood-pressure reading, without diagnosis of hypertension: Secondary | ICD-10-CM | POA: Diagnosis not present

## 2020-07-31 DIAGNOSIS — E782 Mixed hyperlipidemia: Secondary | ICD-10-CM

## 2020-07-31 DIAGNOSIS — R7989 Other specified abnormal findings of blood chemistry: Secondary | ICD-10-CM

## 2020-07-31 DIAGNOSIS — N529 Male erectile dysfunction, unspecified: Secondary | ICD-10-CM

## 2020-07-31 DIAGNOSIS — Z6841 Body Mass Index (BMI) 40.0 and over, adult: Secondary | ICD-10-CM

## 2020-07-31 DIAGNOSIS — Z1211 Encounter for screening for malignant neoplasm of colon: Secondary | ICD-10-CM

## 2020-07-31 MED ORDER — VITAMIN D3 1.25 MG (50000 UT) PO CAPS
ORAL_CAPSULE | ORAL | 1 refills | Status: DC
  Filled 2020-07-31: qty 24, 84d supply, fill #0
  Filled 2021-04-02: qty 24, 84d supply, fill #1

## 2020-07-31 MED ORDER — AMLODIPINE BESYLATE 2.5 MG PO TABS
2.5000 mg | ORAL_TABLET | Freq: Every day | ORAL | 2 refills | Status: DC
  Filled 2020-07-31: qty 30, 30d supply, fill #0

## 2020-07-31 MED ORDER — LISINOPRIL 2.5 MG PO TABS: 2.5000 mg | ORAL_TABLET | Freq: Every day | ORAL | 1 refills | Status: DC

## 2020-07-31 NOTE — Progress Notes (Signed)
I,Yamilka [employer] as a Education administrator for [employer], FNP.,have documented all relevant documentation on the behalf of [name], FNP,as directed by  [name], FNP while in the presence of [name], [place]. This visit occurred during the SARS-CoV-2 public health emergency.  Safety protocols were in place, including screening questions prior to the visit, additional usage of staff PPE, and extensive cleaning of exam room while observing appropriate contact time as indicated for disinfecting solutions.  Subjective:     Patient ID: Grant Hernandez , male    DOB: 09-06-1975 , 45 y.o.   MRN: [id number]   Chief Complaint  Patient presents with  . Hypertension  . Prediabetes    HPI  Patient here for a f/u on his prediabetes and bp. Reports he is exercising regularly with weights and he is eating multiple protein bars. He is also reporting a low libido - he has no desire and has problems with initiating. He has had a low testosterone in the past.   Wt Readings from Last 3 Encounters: 07/31/20 : 298 lb (135.2 kg) 02/19/20 : 285 lb 12.8 oz (129.6 kg) 12/13/19 : 297 lb 3.2 oz (134.8 kg)  Hypertension This is a chronic problem. The current episode started more than 1 year ago. The problem is unchanged. The problem is controlled. Pertinent negatives include no anxiety, chest pain, headaches, palpitations or shortness of breath. There are no associated agents to hypertension. Risk factors for coronary artery disease include obesity, sedentary lifestyle and male gender. Past treatments include ACE inhibitors. The current treatment provides significant improvement. There are no compliance problems.  There is no history of angina. There is no history of chronic renal disease.  Hyperlipidemia This is a chronic problem. The problem is controlled. Recent lipid tests were reviewed and are low. He has no history of chronic renal disease. There are no known factors aggravating his hyperlipidemia.  Pertinent negatives include no chest pain or shortness of breath. Current antihyperlipidemic treatment includes diet change and exercise. There are no compliance problems.  Risk factors for coronary artery disease include male sex, obesity and a sedentary lifestyle.     Past Medical History:  Diagnosis Date  . 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 Hypertension   . MRSA (methicillin resistant Staphylococcus aureus) 2010   right axilla  . Myasthenia gravis ([place])   . Pectoralis muscle rupture      Family History  Problem Relation Age of Onset  . Healthy Mother   . Other Father        unknown     Current Outpatient Medications:  .  lisinopril (ZESTRIL) 2.5 MG tablet, Take 1 tablet (2.5 mg total) by mouth daily., Disp: 90 tablet, Rfl: 1 .  metFORMIN (GLUCOPHAGE) 500 MG tablet, TAKE 1 TABLET BY MOUTH DAILY WITH BREAKFAST, Disp: 90 tablet, Rfl: 1 .  mycophenolate (CELLCEPT) 250 MG capsule, Take by mouth., Disp: , Rfl:  .  mycophenolate (CELLCEPT) 250 MG capsule, Take 1 capsule (250 mg total) by mouth 2 (two) times daily, take with two 500 mg tablets twice daily for total of 1250 mg twice daily, Disp: 180 capsule, Rfl: 3 .  mycophenolate (CELLCEPT) 500 MG tablet, Take 2 tablets (1,000 mg total) by mouth 2 (two) times daily Take with 250 mg twice daily for total of 1250 mg twice daily, Disp: 360 tablet, Rfl: 3 .  pyridostigmine (MESTINON) 60 MG tablet, TAKE 1  AND 1/2 TABLETS BY MOUTH THREE TIMES DAILY, Disp: 405 tablet, Rfl: 3 .  Cholecalciferol (VITAMIN D3) 1.25 MG (50000 UT) CAPS, TAKE 1 CAPSULE BY MOUTH ON TUESDAY AND FRIDAY, Disp: 24 capsule, Rfl: 1   No Known Allergies   Review of Systems  Constitutional: Negative.  Negative for fatigue.  Eyes: Negative.   Respiratory: Negative.  Negative for shortness of breath and wheezing.   Cardiovascular: Negative.  Negative for chest pain, palpitations and leg  swelling.  Gastrointestinal: Negative.   Endocrine: Negative for polydipsia, polyphagia and polyuria.  Genitourinary:       Decreased libido  Musculoskeletal: Negative.   Skin: Negative.   Neurological: Negative for dizziness and headaches.  Psychiatric/Behavioral: Negative.      Today's Vitals   07/31/20 0916  BP: 126/78  Pulse: 68  Temp: 98.2 F (36.8 C)  Weight: 298 lb (135.2 kg)  Height: 5\' 10"  (1.778 m)  PainSc: 0-No pain   Body mass index is 42.76 kg/m.   Objective:  Physical Exam Vitals reviewed.  Constitutional:      General: He is not in acute distress.    Appearance: Normal appearance. He is obese.  HENT:     Head: Normocephalic.  Cardiovascular:     Rate and Rhythm: Normal rate and regular rhythm.     Pulses: Normal pulses.     Heart sounds: Normal heart sounds. No murmur heard.   Pulmonary:     Effort: Pulmonary effort is normal. No respiratory distress.     Breath sounds: Normal breath sounds.  Skin:    General: Skin is warm.     Findings: No rash.  Neurological:     General: No focal deficit present.     Mental Status: He is alert and oriented to person, place, and time.     Cranial Nerves: No cranial nerve deficit.  Psychiatric:        Mood and Affect: Mood normal.        Behavior: Behavior normal.        Thought Content: Thought content normal.        Judgment: Judgment normal.         Assessment And Plan:     1. essential hypertension  Chronic, well controlled.   He is concerned Lisinopril is causing him decreased libido however he has a low testosterone I will recheck his levels first before making any changes.  - lisinopril (ZESTRIL) 2.5 MG tablet; Take 1 tablet (2.5 mg total) by mouth daily.  Dispense: 90 tablet; Refill: 1  2. Prediabetes  Chronic, stable  Continue with low carbohydrate and low sugar intake - Hemoglobin A1c  3. Mixed hyperlipidemia  Chronic, controlled  No current medications, diet controlled - Lipid  panel  4. Low vitamin D level  Will check vitamin D level and supplement as needed.     Also encouraged to spend 15 minutes in the sun daily.  - VITAMIN D 25 Hydroxy (Vit-D Deficiency, Fractures) - Cholecalciferol (VITAMIN D3) 1.25 MG (50000 UT) CAPS; TAKE 1 CAPSULE BY MOUTH ON TUESDAY AND FRIDAY  Dispense: 24 capsule; Refill: 1  5. Low libido  He has had a low testosterone in the past, was given testosterone transdermal but did not think was effective.   I will recheck testosterone since has been 2 years and will refer to urology - Testosterone, Total  6. Erectile dysfunction, unspecified erectile dysfunction type  7. Myasthenia gravis ([place])  Continue with cellcept and follow up at [facility]  8. Encounter for screening colonoscopy  According to USPTF Colorectal cancer Screening guidelines. Colonoscopy is recommended every 10 years, starting at age 18 years.  Will refer to GI for colon cancer screening. - Ambulatory referral to Gastroenterology  9. Class 3 severe obesity due to excess calories without serious comorbidity with body mass index (BMI) of 40.0 to 44.9 in adult [facility])  Chronic, he has gained approximately 13 lbs since his last visit  He has been eating an increased amount of protein bars/shakes. I have explained to him to cut back on this to see if this is causing him to gain muscle due to he is exercising 3-5 times a week.  Discussed healthy diet and regular exercise options   Encouraged to exercise at least 150 minutes per week with 2 days of strength training  Patient was given opportunity to ask questions. Patient verbalized understanding of the plan and was able to repeat key elements of the plan. All questions were answered to their satisfaction.  [name], FNP   I, [name], FNP, have reviewed all documentation for this visit. The documentation on 07/31/20 for the exam, diagnosis, procedures, and orders are all accurate and complete.   IF YOU  HAVE BEEN REFERRED TO A SPECIALIST, IT MAY TAKE 1-2 WEEKS TO SCHEDULE/PROCESS THE REFERRAL. IF YOU HAVE NOT HEARD FROM US/SPECIALIST IN TWO WEEKS, PLEASE GIVE [place] A CALL AT [phone].   THE PATIENT IS ENCOURAGED TO PRACTICE SOCIAL DISTANCING DUE TO THE COVID-19 PANDEMIC.

## 2020-07-31 NOTE — Patient Instructions (Signed)

## 2020-08-01 ENCOUNTER — Other Ambulatory Visit (HOSPITAL_BASED_OUTPATIENT_CLINIC_OR_DEPARTMENT_OTHER): Payer: Self-pay

## 2020-08-01 LAB — TESTOSTERONE: Testosterone: 251 ng/dL — ABNORMAL LOW (ref 264–916)

## 2020-08-01 LAB — HEMOGLOBIN A1C
Est. average glucose Bld gHb Est-mCnc: 137 mg/dL
Hgb A1c MFr Bld: 6.4 % — ABNORMAL HIGH (ref 4.8–5.6)

## 2020-08-01 LAB — LIPID PANEL
Chol/HDL Ratio: 3.9 ratio (ref 0.0–5.0)
Cholesterol, Total: 156 mg/dL (ref 100–199)
HDL: 40 mg/dL (ref >39)
LDL Chol Calc (NIH): 104 mg/dL — ABNORMAL HIGH (ref 0–99)
Triglycerides: 58 mg/dL (ref 0–149)
VLDL Cholesterol Cal: 12 mg/dL (ref 5–40)

## 2020-08-01 LAB — VITAMIN D 25 HYDROXY (VIT D DEFICIENCY, FRACTURES): Vit D, 25-Hydroxy: 69.3 ng/mL (ref 30.0–100.0)

## 2020-09-04 ENCOUNTER — Other Ambulatory Visit (HOSPITAL_COMMUNITY): Payer: Self-pay

## 2020-09-04 ENCOUNTER — Other Ambulatory Visit: Payer: Self-pay

## 2020-09-04 ENCOUNTER — Other Ambulatory Visit (HOSPITAL_BASED_OUTPATIENT_CLINIC_OR_DEPARTMENT_OTHER): Payer: Self-pay

## 2020-09-04 DIAGNOSIS — R030 Elevated blood-pressure reading, without diagnosis of hypertension: Secondary | ICD-10-CM

## 2020-09-04 MED ORDER — LISINOPRIL 2.5 MG PO TABS
2.5000 mg | ORAL_TABLET | Freq: Every day | ORAL | 1 refills | Status: DC
  Filled 2020-09-04 – 2020-09-09 (×2): qty 90, 90d supply, fill #0
  Filled 2020-12-02: qty 90, 90d supply, fill #1

## 2020-09-05 ENCOUNTER — Other Ambulatory Visit (HOSPITAL_COMMUNITY): Payer: Self-pay

## 2020-09-09 ENCOUNTER — Other Ambulatory Visit (HOSPITAL_BASED_OUTPATIENT_CLINIC_OR_DEPARTMENT_OTHER): Payer: Self-pay

## 2020-09-09 ENCOUNTER — Other Ambulatory Visit (HOSPITAL_COMMUNITY): Payer: Self-pay

## 2020-10-02 ENCOUNTER — Other Ambulatory Visit (HOSPITAL_BASED_OUTPATIENT_CLINIC_OR_DEPARTMENT_OTHER): Payer: Self-pay

## 2020-10-02 MED ORDER — CLOMIPHENE CITRATE 50 MG PO TABS
ORAL_TABLET | ORAL | 3 refills | Status: DC
  Filled 2020-10-02 – 2020-10-06 (×2): qty 15, 30d supply, fill #0
  Filled 2020-11-04: qty 15, 30d supply, fill #1
  Filled 2020-12-02: qty 15, 30d supply, fill #2
  Filled 2021-01-01 (×4): qty 15, 30d supply, fill #3

## 2020-10-06 ENCOUNTER — Other Ambulatory Visit (HOSPITAL_BASED_OUTPATIENT_CLINIC_OR_DEPARTMENT_OTHER): Payer: Self-pay

## 2020-10-22 ENCOUNTER — Other Ambulatory Visit (HOSPITAL_COMMUNITY): Payer: Self-pay

## 2020-10-22 MED FILL — Metformin HCl Tab 500 MG: ORAL | 90 days supply | Qty: 90 | Fill #1 | Status: CN

## 2020-10-23 ENCOUNTER — Other Ambulatory Visit (HOSPITAL_COMMUNITY): Payer: Self-pay

## 2020-10-23 ENCOUNTER — Other Ambulatory Visit (HOSPITAL_BASED_OUTPATIENT_CLINIC_OR_DEPARTMENT_OTHER): Payer: Self-pay

## 2020-10-23 MED FILL — Metformin HCl Tab 500 MG: ORAL | 90 days supply | Qty: 90 | Fill #0 | Status: AC

## 2020-10-30 ENCOUNTER — Other Ambulatory Visit (HOSPITAL_BASED_OUTPATIENT_CLINIC_OR_DEPARTMENT_OTHER): Payer: Self-pay

## 2020-10-30 MED FILL — Pyridostigmine Bromide Tab 60 MG: ORAL | 90 days supply | Qty: 405 | Fill #0 | Status: AC

## 2020-11-04 ENCOUNTER — Other Ambulatory Visit (HOSPITAL_BASED_OUTPATIENT_CLINIC_OR_DEPARTMENT_OTHER): Payer: Self-pay

## 2020-11-11 ENCOUNTER — Encounter: Payer: Self-pay | Admitting: Gastroenterology

## 2020-12-02 ENCOUNTER — Other Ambulatory Visit (HOSPITAL_BASED_OUTPATIENT_CLINIC_OR_DEPARTMENT_OTHER): Payer: Self-pay

## 2020-12-16 ENCOUNTER — Encounter: Payer: Self-pay | Admitting: Nurse Practitioner

## 2020-12-16 ENCOUNTER — Other Ambulatory Visit: Payer: No Typology Code available for payment source

## 2020-12-16 ENCOUNTER — Ambulatory Visit (INDEPENDENT_AMBULATORY_CARE_PROVIDER_SITE_OTHER): Payer: No Typology Code available for payment source | Admitting: Nurse Practitioner

## 2020-12-16 ENCOUNTER — Other Ambulatory Visit: Payer: Self-pay

## 2020-12-16 VITALS — BP 124/78 | HR 63 | Temp 98.7°F | Ht 70.0 in | Wt 302.0 lb

## 2020-12-16 DIAGNOSIS — E782 Mixed hyperlipidemia: Secondary | ICD-10-CM

## 2020-12-16 DIAGNOSIS — Z0000 Encounter for general adult medical examination without abnormal findings: Secondary | ICD-10-CM

## 2020-12-16 DIAGNOSIS — R7303 Prediabetes: Secondary | ICD-10-CM | POA: Diagnosis not present

## 2020-12-16 DIAGNOSIS — R030 Elevated blood-pressure reading, without diagnosis of hypertension: Secondary | ICD-10-CM | POA: Diagnosis not present

## 2020-12-16 DIAGNOSIS — L918 Other hypertrophic disorders of the skin: Secondary | ICD-10-CM

## 2020-12-16 DIAGNOSIS — R7989 Other specified abnormal findings of blood chemistry: Secondary | ICD-10-CM

## 2020-12-16 DIAGNOSIS — Z6841 Body Mass Index (BMI) 40.0 and over, adult: Secondary | ICD-10-CM

## 2020-12-16 DIAGNOSIS — Z79899 Other long term (current) drug therapy: Secondary | ICD-10-CM

## 2020-12-16 LAB — POCT UA - MICROALBUMIN
Albumin/Creatinine Ratio, Urine, POC: <30
Creatinine, POC: 300 mg/dL
Microalbumin Ur, POC: 10 mg/L

## 2020-12-16 LAB — POCT URINALYSIS DIPSTICK
Bilirubin, UA: NEGATIVE
Blood, UA: NEGATIVE
Glucose, UA: NEGATIVE
Ketones, UA: NEGATIVE
Leukocytes, UA: NEGATIVE
Nitrite, UA: NEGATIVE
Protein, UA: NEGATIVE
Spec Grav, UA: >=1.03 — AB (ref 1.010–1.025)
Urobilinogen, UA: 0.2 E.U./dL
pH, UA: 5.5 (ref 5.0–8.0)

## 2020-12-16 NOTE — Progress Notes (Signed)
I,Yamilka J Llittleton,acting as a Education administrator for [employer], FNP.,have documented all relevant documentation on the behalf of [name], FNP,as directed by  [name], FNP while in the presence of [name], [place].   This visit occurred during the SARS-CoV-2 public health emergency.  Safety protocols were in place, including screening questions prior to the visit, additional usage of staff PPE, and extensive cleaning of exam room while observing appropriate contact time as indicated for disinfecting solutions.  Subjective:     Patient ID: Grant Hernandez , male    DOB: [birth year] , 45 y.o.   MRN: [id number]   Chief Complaint  Patient presents with   Annual Exam    HPI  Patient here for hm.  Colonoscopy is scheduled for November 18th. Continues to be followed by Neurology and Urology (last seen in August and PSA) at [facility].   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, also trouble urinating post op   Hypertension    MRSA (methicillin resistant Staphylococcus aureus) 2010   right axilla   Myasthenia gravis ([place])    Pectoralis muscle rupture      Family History  Problem Relation Age of Onset   Healthy Mother    Other Father        unknown     Current Outpatient Medications:    Cholecalciferol (VITAMIN D3) 1.25 MG (50000 UT) CAPS, TAKE 1 CAPSULE BY MOUTH ON TUESDAY AND FRIDAY, Disp: 24 capsule, Rfl: 1   clomiPHENE (CLOMID) 50 MG tablet, Take 1/2 tablet by mouth once daily, Disp: 15 tablet, Rfl: 3   lisinopril (ZESTRIL) 2.5 MG tablet, Take 1 tablet (2.5 mg total) by mouth daily., Disp: 90 tablet, Rfl: 1   metFORMIN (GLUCOPHAGE) 500 MG tablet, TAKE 1 TABLET BY MOUTH DAILY WITH BREAKFAST, Disp: 90 tablet, Rfl: 1   mycophenolate (CELLCEPT) 250  MG capsule, Take 1 capsule (250 mg total) by mouth 2 (two) times daily, take with two 500 mg tablets twice daily for total of 1250 mg twice daily, Disp: 180 capsule, Rfl: 3   mycophenolate (CELLCEPT) 500 MG tablet, Take 2 tablets (1,000 mg total) by mouth 2 (two) times daily Take with 250 mg twice daily for total of 1250 mg twice daily, Disp: 360 tablet, Rfl: 3   pyridostigmine (MESTINON) 60 MG tablet, TAKE 1 AND 1/2 TABLETS BY MOUTH THREE TIMES DAILY, Disp: 405 tablet, Rfl: 3   No Known Allergies   Men's preventive visit. Patient Health Questionnaire (PHQ-2) is  [place] Office Visit from 12/16/2020 in [facility]  PHQ-2 Total Score 0     Patient is on a Regular diet. Exercising with lifting. Marital status: Married. Relevant history for alcohol use is:  Social History   Substance and Sexual Activity  Alcohol Use Yes   Comment: social   Relevant history for tobacco use is:  Social History   Tobacco Use  Smoking Status Never  Smokeless Tobacco Never  .   Review of Systems  Constitutional: Negative.   HENT: Negative.    Eyes: Negative.   Respiratory: Negative.    Cardiovascular: Negative.  Negative for chest pain, palpitations and leg swelling.  Gastrointestinal: Negative.   Endocrine: Negative.   Genitourinary:  Negative.   Musculoskeletal: Negative.   Skin: Negative.   Allergic/Immunologic: Negative for environmental allergies.  Neurological: Negative.  Negative for dizziness and headaches.  Hematological: Negative.   Psychiatric/Behavioral: Negative.      Today's Vitals   12/16/20 0955  BP: 124/78  Pulse: 63  Temp: 98.7 F (37.1 C)  Weight: (!) 302 lb (137 kg)  Height: '5\' 10"'  (1.778 m)  PainSc: 0-No pain   Body mass index is 43.33 kg/m.   Objective:  Physical Exam Vitals reviewed.  Constitutional:      General: He is not in acute distress.    Appearance: Normal appearance. He is obese.  HENT:     Head: Normocephalic and  atraumatic.     Right Ear: Tympanic membrane, ear canal and external ear normal. There is no impacted cerumen.     Left Ear: Tympanic membrane, ear canal and external ear normal. There is no impacted cerumen.     Nose:     Comments: Deferred - masked     Mouth/Throat:     Comments: Deferred - masked  Eyes:     Extraocular Movements: Extraocular movements intact.     Conjunctiva/sclera: Conjunctivae normal.     Pupils: Pupils are equal, round, and reactive to light.  Cardiovascular:     Rate and Rhythm: Normal rate and regular rhythm.     Pulses: Normal pulses.     Heart sounds: Normal heart sounds. No murmur heard. Pulmonary:     Effort: Pulmonary effort is normal. No respiratory distress.     Breath sounds: Normal breath sounds. No wheezing.  Abdominal:     General: Abdomen is flat. Bowel sounds are normal. There is no distension.     Palpations: Abdomen is soft.     Tenderness: There is no abdominal tenderness.  Genitourinary:    Comments: Deferred - followed by Urologist Musculoskeletal:        General: No swelling or tenderness. Normal range of motion.     Cervical back: Normal range of motion and neck supple.  Skin:    General: Skin is warm and dry.     Capillary Refill: Capillary refill takes less than 2 seconds.     Comments: Multiple skin tags to posterior neck and one underneath right eye   Neurological:     General: No focal deficit present.     Mental Status: He is alert and oriented to person, place, and time.     Cranial Nerves: No cranial nerve deficit.     Motor: No weakness.  Psychiatric:        Mood and Affect: Mood normal.        Behavior: Behavior normal.        Thought Content: Thought content normal.        Judgment: Judgment normal.        Assessment And Plan:    1. Encounter for general adult medical examination w/o abnormal findings Behavior modifications discussed and diet history reviewed.   Pt will continue to exercise regularly and modify  diet with low GI, plant based foods and decrease intake of processed foods.  Recommend intake of daily multivitamin, Vitamin D, and calcium.  Recommend for preventive screenings, as well as recommend immunizations that include influenza, TDAP (up to date)  2. Class 3 severe obesity with body mass index (BMI) of 40.0 to 44.9 in adult, unspecified obesity type, unspecified whether serious comorbidity present ([place])  3. essential hypertension Comments: Blood pressure is well controlled Continue  current medications EKG done with NSR  - POCT Urinalysis Dipstick (81002) - POCT UA - Microalbumin - EKG 12-Lead - CMP14+EGFR  4. Prediabetes Comments: Stable, discussed adding GLP 1 if HgbA1c is worse. At thistime  - Hemoglobin A1c  5. Mixed hyperlipidemia Comments: LDL is slightly elevated, no current medications - Lipid panel  6. Low vitamin D level Will check vitamin D level and supplement as needed.    Also encouraged to spend 15 minutes in the sun daily.  - Vitamin D (25 hydroxy)  7. Skin tags, multiple acquired Comments: Has a skin tag on right lower eyelid and on his posterior neck at this time he is not interested in any treatment/referral  8. Other long term (current) drug therapy - CBC no Diff     Patient was given opportunity to ask questions. Patient verbalized understanding of the plan and was able to repeat key elements of the plan. All questions were answered to their satisfaction.   [name], FNP   I, [name], FNP, have reviewed all documentation for this visit. The documentation on 12/17/20 for the exam, diagnosis, procedures, and orders are all accurate and complete.  THE PATIENT IS ENCOURAGED TO PRACTICE SOCIAL DISTANCING DUE TO THE COVID-19 PANDEMIC.

## 2020-12-16 NOTE — Patient Instructions (Signed)
Health Maintenance, Male Adopting a healthy lifestyle and getting preventive care are important in promoting health and wellness. Ask your health care provider about: The right schedule for you to have regular tests and exams. Things you can do on your own to prevent diseases and keep yourself healthy. What should I know about diet, weight, and exercise? Eat a healthy diet  Eat a diet that includes plenty of vegetables, fruits, low-fat dairy products, and lean protein. Do not eat a lot of foods that are high in solid fats, added sugars, or sodium. Maintain a healthy weight Body mass index (BMI) is a measurement that can be used to identify possible weight problems. It estimates body fat based on height and weight. Your health care provider can help determine your BMI and help you achieve or maintain a healthy weight. Get regular exercise Get regular exercise. This is one of the most important things you can do for your health. Most adults should: Exercise for at least 150 minutes each week. The exercise should increase your heart rate and make you sweat (moderate-intensity exercise). Do strengthening exercises at least twice a week. This is in addition to the moderate-intensity exercise. Spend less time sitting. Even light physical activity can be beneficial. Watch cholesterol and blood lipids Have your blood tested for lipids and cholesterol at 47 years of age, then have this test every 5 years. You may need to have your cholesterol levels checked more often if: Your lipid or cholesterol levels are high. You are older than 47 years of age. You are at high risk for heart disease. What should I know about cancer screening? Many types of cancers can be detected early and may often be prevented. Depending on your health history and family history, you may need to have cancer screening at various ages. This may include screening for: Colorectal cancer. Prostate cancer. Skin cancer. Lung  cancer. What should I know about heart disease, diabetes, and high blood pressure? Blood pressure and heart disease High blood pressure causes heart disease and increases the risk of stroke. This is more likely to develop in people who have high blood pressure readings, are of African descent, or are overweight. Talk with your health care provider about your target blood pressure readings. Have your blood pressure checked: Every 3-5 years if you are 18-39 years of age. Every year if you are 40 years old or older. If you are between the ages of 65 and 75 and are a current or former smoker, ask your health care provider if you should have a one-time screening for abdominal aortic aneurysm (AAA). Diabetes Have regular diabetes screenings. This checks your fasting blood sugar level. Have the screening done: Once every three years after age 45 if you are at a normal weight and have a low risk for diabetes. More often and at a younger age if you are overweight or have a high risk for diabetes. What should I know about preventing infection? Hepatitis B If you have a higher risk for hepatitis B, you should be screened for this virus. Talk with your health care provider to find out if you are at risk for hepatitis B infection. Hepatitis C Blood testing is recommended for: Everyone born from 1945 through 1965. Anyone with known risk factors for hepatitis C. Sexually transmitted infections (STIs) You should be screened each year for STIs, including gonorrhea and chlamydia, if: You are sexually active and are younger than 47 years of age. You are older than 47 years   of age and your health care provider tells you that you are at risk for this type of infection. Your sexual activity has changed since you were last screened, and you are at increased risk for chlamydia or gonorrhea. Ask your health care provider if you are at risk. Ask your health care provider about whether you are at high risk for HIV.  Your health care provider may recommend a prescription medicine to help prevent HIV infection. If you choose to take medicine to prevent HIV, you should first get tested for HIV. You should then be tested every 3 months for as long as you are taking the medicine. Follow these instructions at home: Lifestyle Do not use any products that contain nicotine or tobacco, such as cigarettes, e-cigarettes, and chewing tobacco. If you need help quitting, ask your health care provider. Do not use street drugs. Do not share needles. Ask your health care provider for help if you need support or information about quitting drugs. Alcohol use Do not drink alcohol if your health care provider tells you not to drink. If you drink alcohol: Limit how much you have to 0-2 drinks a day. Be aware of how much alcohol is in your drink. In the [place] one drink equals one 12 oz bottle of beer (355 mL), one 5 oz glass of wine (148 mL), or one 1 oz glass of hard liquor (44 mL). General instructions Schedule regular health, dental, and eye exams. Stay current with your vaccines. Tell your health care provider if: You often feel depressed. You have ever been abused or do not feel safe at home. Summary Adopting a healthy lifestyle and getting preventive care are important in promoting health and wellness. Follow your health care provider's instructions about healthy diet, exercising, and getting tested or screened for diseases. Follow your health care provider's instructions on monitoring your cholesterol and blood pressure. This information is not intended to replace advice given to you by your health care provider. Make sure you discuss any questions you have with your health care provider. Document Revised: 05/02/2020 Document Reviewed: 02/15/2018 Elsevier Patient Education  2022 Elsevier Inc.  

## 2020-12-17 ENCOUNTER — Other Ambulatory Visit (HOSPITAL_BASED_OUTPATIENT_CLINIC_OR_DEPARTMENT_OTHER): Payer: Self-pay

## 2020-12-17 LAB — CBC
Hematocrit: 44.8 % (ref 37.5–51.0)
Hemoglobin: 14.1 g/dL (ref 13.0–17.7)
MCH: 27.3 pg (ref 26.6–33.0)
MCHC: 31.5 g/dL (ref 31.5–35.7)
MCV: 87 fL (ref 79–97)
Platelets: 253 10*3/uL (ref 150–450)
RBC: 5.16 x10E6/uL (ref 4.14–5.80)
RDW: 13.7 % (ref 11.6–15.4)
WBC: 5.6 10*3/uL (ref 3.4–10.8)

## 2020-12-17 LAB — CMP14+EGFR
ALT: 18 IU/L (ref 0–44)
AST: 18 IU/L (ref 0–40)
Albumin/Globulin Ratio: 1.4 (ref 1.2–2.2)
Albumin: 4.4 g/dL (ref 4.0–5.0)
Alkaline Phosphatase: 51 IU/L (ref 44–121)
BUN/Creatinine Ratio: 12 (ref 9–20)
BUN: 15 mg/dL (ref 6–24)
Bilirubin Total: 0.3 mg/dL (ref 0.0–1.2)
CO2: 25 mmol/L (ref 20–29)
Calcium: 9.7 mg/dL (ref 8.7–10.2)
Chloride: 105 mmol/L (ref 96–106)
Creatinine, Ser: 1.24 mg/dL (ref 0.76–1.27)
Globulin, Total: 3.1 g/dL (ref 1.5–4.5)
Glucose: 95 mg/dL (ref 70–99)
Potassium: 4.9 mmol/L (ref 3.5–5.2)
Sodium: 140 mmol/L (ref 134–144)
Total Protein: 7.5 g/dL (ref 6.0–8.5)
eGFR: 72 mL/min/{1.73_m2} (ref >59)

## 2020-12-17 LAB — LIPID PANEL
Chol/HDL Ratio: 3.7 ratio (ref 0.0–5.0)
Cholesterol, Total: 131 mg/dL (ref 100–199)
HDL: 35 mg/dL — ABNORMAL LOW (ref >39)
LDL Chol Calc (NIH): 84 mg/dL (ref 0–99)
Triglycerides: 53 mg/dL (ref 0–149)
VLDL Cholesterol Cal: 12 mg/dL (ref 5–40)

## 2020-12-17 LAB — VITAMIN D 25 HYDROXY (VIT D DEFICIENCY, FRACTURES): Vit D, 25-Hydroxy: 55.3 ng/mL (ref 30.0–100.0)

## 2020-12-17 LAB — HEMOGLOBIN A1C
Est. average glucose Bld gHb Est-mCnc: 128 mg/dL
Hgb A1c MFr Bld: 6.1 % — ABNORMAL HIGH (ref 4.8–5.6)

## 2020-12-24 ENCOUNTER — Encounter: Payer: No Typology Code available for payment source | Admitting: Nurse Practitioner

## 2021-01-01 ENCOUNTER — Other Ambulatory Visit: Payer: Self-pay

## 2021-01-01 ENCOUNTER — Ambulatory Visit (AMBULATORY_SURGERY_CENTER): Payer: No Typology Code available for payment source | Admitting: *Deleted

## 2021-01-01 ENCOUNTER — Other Ambulatory Visit (HOSPITAL_BASED_OUTPATIENT_CLINIC_OR_DEPARTMENT_OTHER): Payer: Self-pay

## 2021-01-01 ENCOUNTER — Other Ambulatory Visit (HOSPITAL_COMMUNITY): Payer: Self-pay

## 2021-01-01 VITALS — Ht 71.0 in | Wt 290.0 lb

## 2021-01-01 DIAGNOSIS — Z1211 Encounter for screening for malignant neoplasm of colon: Secondary | ICD-10-CM

## 2021-01-01 MED ORDER — NA SULFATE-K SULFATE-MG SULF 17.5-3.13-1.6 GM/177ML PO SOLN
1.0000 | ORAL | 0 refills | Status: DC
  Filled 2021-01-01: qty 354, 2d supply, fill #0

## 2021-01-01 NOTE — Progress Notes (Signed)

## 2021-01-15 ENCOUNTER — Encounter: Payer: No Typology Code available for payment source | Admitting: Gastroenterology

## 2021-01-20 ENCOUNTER — Other Ambulatory Visit: Payer: Self-pay | Admitting: Nurse Practitioner

## 2021-01-20 DIAGNOSIS — R7303 Prediabetes: Secondary | ICD-10-CM

## 2021-01-21 ENCOUNTER — Other Ambulatory Visit (HOSPITAL_BASED_OUTPATIENT_CLINIC_OR_DEPARTMENT_OTHER): Payer: Self-pay

## 2021-01-21 MED ORDER — METFORMIN HCL 500 MG PO TABS
ORAL_TABLET | Freq: Every day | ORAL | 1 refills | Status: DC
  Filled 2021-01-21: qty 90, 90d supply, fill #0
  Filled 2021-04-20: qty 90, 90d supply, fill #1

## 2021-01-23 ENCOUNTER — Encounter: Payer: Self-pay | Admitting: Gastroenterology

## 2021-01-23 ENCOUNTER — Ambulatory Visit (AMBULATORY_SURGERY_CENTER): Payer: No Typology Code available for payment source | Admitting: Gastroenterology

## 2021-01-23 ENCOUNTER — Other Ambulatory Visit: Payer: Self-pay

## 2021-01-23 VITALS — BP 140/83 | HR 61 | Temp 98.7°F | Resp 18 | Ht 70.0 in | Wt 290.0 lb

## 2021-01-23 DIAGNOSIS — D124 Benign neoplasm of descending colon: Secondary | ICD-10-CM

## 2021-01-23 DIAGNOSIS — K635 Polyp of colon: Secondary | ICD-10-CM | POA: Diagnosis not present

## 2021-01-23 DIAGNOSIS — Z1211 Encounter for screening for malignant neoplasm of colon: Secondary | ICD-10-CM

## 2021-01-23 MED ORDER — SODIUM CHLORIDE 0.9 % IV SOLN: 500.0000 mL | Freq: Once | INTRAVENOUS | Status: DC

## 2021-01-23 NOTE — Op Note (Signed)
[place] Patient Name: Grant Hernandez Procedure Date: 01/23/2021 7:59 AM MRN: [id number] Endoscopist: Nicki Reaper E. Candis Schatz , MD Age: 45 Referring MD:  Date of Birth: 16-Dec-1975 Gender: Male Account #: [id number] Procedure:                Colonoscopy Indications:              Screening for colorectal malignant neoplasm, This                            is the patient's first colonoscopy Medicines:                Monitored Anesthesia Care Procedure:                Pre-Anesthesia Assessment:                           - Prior to the procedure, a History and Physical                            was performed, and patient medications and                            allergies were reviewed. The patient's tolerance of                            previous anesthesia was also reviewed. The risks                            and benefits of the procedure and the sedation                            options and risks were discussed with the patient.                            All questions were answered, and informed consent                            was obtained. Prior Anticoagulants: The patient has                            taken no previous anticoagulant or antiplatelet                            agents. ASA Grade Assessment: III - A patient with                            severe systemic disease. After reviewing the risks                            and benefits, the patient was deemed in                            satisfactory condition to undergo the procedure.  After obtaining informed consent, the colonoscope                            was passed under direct vision. Throughout the                            procedure, the patient's blood pressure, pulse, and                            oxygen saturations were monitored continuously. The                            Olympus CF-HQ190L [id number]) Colonoscope was                            introduced through the  anus and advanced to the the                            terminal ileum, with identification of the                            appendiceal orifice and IC valve. The colonoscopy                            was performed without difficulty. The patient                            tolerated the procedure well. The quality of the                            bowel preparation was good. The terminal ileum,                            ileocecal valve, appendiceal orifice, and rectum                            were photographed. The bowel preparation used was                            SUPREP via split dose instruction. Scope In: 8:02:38 AM Scope Out: 8:21:39 AM Scope Withdrawal Time: 0 hours 16 minutes 17 seconds  Total Procedure Duration: 0 hours 19 minutes 1 second  Findings:                 The perianal and digital rectal examinations were                            normal. Pertinent negatives include normal                            sphincter tone and no palpable rectal lesions.                           Two sessile polyps were found in the descending  colon. The polyps were 2 mm in size. These polyps                            were removed with a jumbo cold forceps. Resection                            and retrieval were complete. Estimated blood loss                            was minimal.                           A few small-mouthed diverticula were found in the                            sigmoid colon and descending colon.                           The exam was otherwise normal throughout the                            examined colon.                           The terminal ileum appeared normal.                           The retroflexed view of the distal rectum and anal                            verge was normal and showed no anal or rectal                            abnormalities. Complications:            No immediate complications. Estimated Blood Loss:      Estimated blood loss was minimal. Impression:               - Two 2 mm polyps in the descending colon, removed                            with a jumbo cold forceps. Resected and retrieved.                           - Diverticulosis in the sigmoid colon and in the                            descending colon.                           - The examined portion of the ileum was normal.                           - The distal rectum and anal verge are normal on  retroflexion view. Recommendation:           - Patient has a contact number available for                            emergencies. The signs and symptoms of potential                            delayed complications were discussed with the                            patient. Return to normal activities tomorrow.                            Written discharge instructions were provided to the                            patient.                           - Resume previous diet.                           - Continue present medications.                           - Await pathology results.                           - Repeat colonoscopy (date not yet determined) for                            surveillance based on pathology results. Liane Tribbey E. Candis Schatz, MD 01/23/2021 8:34:41 AM This report has been signed electronically.

## 2021-01-23 NOTE — Progress Notes (Signed)
To pacu, VSS. Report to [email or web address] 

## 2021-01-23 NOTE — Progress Notes (Signed)
Called to room to assist during endoscopic procedure.  Patient ID and intended procedure confirmed with present staff. Received instructions for my participation in the procedure from the performing physician.  

## 2021-01-23 NOTE — Progress Notes (Signed)
[facility] History and Physical   Primary Care Physician:  [name], FNP   Reason for Procedure:   Colon cancer screening  Plan:    Screening colonoscopy     HPI: Grant Hernandez is a 45 y.o. male with history of myasthenia gravis undergoing initial average risk screening colonoscopy.  He has no family history of colon cancer and no chronic GI symptoms.    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 Authorizing Provider  Cholecalciferol (VITAMIN D3) 1.25 MG (50000 UT) CAPS TAKE 1 CAPSULE BY MOUTH ON TUESDAY AND FRIDAY 07/31/20 07/31/21 Yes [name], FNP  clomiPHENE (CLOMID) 50 MG tablet Take 1/2 tablet by mouth once daily 10/02/20  Yes   lisinopril (ZESTRIL) 2.5 MG tablet Take 1 tablet (2.5 mg total) by mouth daily. 09/04/20  Yes [name], FNP  metFORMIN (GLUCOPHAGE) 500 MG tablet TAKE 1 TABLET BY MOUTH DAILY WITH BREAKFAST 01/21/21 01/21/22 Yes [name], FNP  mycophenolate (CELLCEPT) 250 MG capsule Take 1 capsule (250 mg total) by mouth 2 (two) times daily, take with two 500 mg tablets twice daily for total of 1250 mg twice daily 06/26/20  Yes   mycophenolate (CELLCEPT) 500 MG tablet Take 2 tablets (1,000 mg total) by mouth 2 (two) times daily Take with 250 mg twice daily for total of 1250 mg twice daily  06/26/20  Yes   pyridostigmine (MESTINON) 60 MG tablet TAKE 1 AND 1/2 TABLETS BY MOUTH THREE TIMES DAILY 12/26/19 02/02/21 Yes Juel, Jennette Kettle, MD    Current Outpatient Medications  Medication Sig Dispense Refill   Cholecalciferol (VITAMIN D3) 1.25 MG (50000 UT) CAPS TAKE 1 CAPSULE BY MOUTH ON TUESDAY AND FRIDAY 24 capsule 1   clomiPHENE (CLOMID) 50 MG tablet Take 1/2 tablet by mouth once daily 15 tablet 3   lisinopril (ZESTRIL) 2.5 MG tablet Take 1 tablet (2.5 mg total) by mouth daily. 90 tablet 1   metFORMIN (GLUCOPHAGE) 500 MG tablet TAKE 1 TABLET BY MOUTH DAILY WITH BREAKFAST 90 tablet 1   mycophenolate (CELLCEPT) 250 MG capsule Take 1 capsule (250 mg total) by mouth 2 (two) times daily, take with two 500 mg tablets twice daily for total of 1250 mg twice daily 180 capsule 3   mycophenolate (CELLCEPT) 500 MG tablet Take 2 tablets (1,000 mg total) by mouth 2 (two) times daily Take with 250 mg twice daily for total of 1250 mg twice daily 360 tablet 3   pyridostigmine (MESTINON) 60 MG tablet TAKE 1 AND 1/2 TABLETS BY MOUTH THREE TIMES DAILY 405 tablet 3   Current Facility-Administered Medications  Medication Dose Route Frequency Provider Last Rate Last Admin   0.9 %  sodium chloride infusion  500 mL Intravenous Once Daryel November, MD        Allergies as of 01/23/2021   (  No Known Allergies)    Family History  Problem Relation Age of Onset   Healthy Mother    Other Father        unknown   Colon cancer Neg Hx    Esophageal cancer Neg Hx    Stomach cancer Neg Hx    Rectal cancer Neg Hx     Social History   Socioeconomic History   Marital status: Married    Spouse name: Not on file   Number of children: Not on file   Years of education: Not on file   Highest education level: Not on file  Occupational History   Not on file  Tobacco Use   Smoking status: Never   Smokeless tobacco: Never  Vaping Use   Vaping Use: Never used  Substance and Sexual Activity   Alcohol  use: Yes    Comment: social   Drug use: No   Sexual activity: Yes    Birth control/protection: Condom  Other Topics Concern   Not on file  Social History Narrative   Not on file   Social Determinants of Health   Financial Resource Strain: Not on file  Food Insecurity: Not on file  Transportation Needs: Not on file  Physical Activity: Not on file  Stress: Not on file  Social Connections: Not on file  Intimate Partner Violence: Not on file    Review of Systems:  All other review of systems negative except as mentioned in the HPI.  Physical Exam: Vital signs BP (!) 147/71   Pulse 68   Temp 98.7 F (37.1 C)   Ht 5\' 10"  (1.778 m)   Wt 290 lb (131.5 kg)   SpO2 99%   BMI 41.61 kg/m   General:   Alert,  Well-developed, well-nourished, pleasant and cooperative in NAD Airway:  Mallampati 2 Lungs:  Clear throughout to auscultation.   Heart:  Regular rate and rhythm; no murmurs, clicks, rubs,  or gallops.  Thymectomy scar Abdomen:  Soft, nontender and nondistended. Normal bowel sounds.   Neuro/Psych:  Normal mood and affect. A and O x 3.  Left sided ptosis   Vivian Okelley E. Candis Schatz, MD [facility]

## 2021-01-23 NOTE — Patient Instructions (Signed)
Handouts on diverticulosis and polyps given to patient. Await pathology results. Repeat colonoscopy for screening will be determined based off of pathology results. Return to previous diet and continue present medications.   YOU HAD AN ENDOSCOPIC PROCEDURE TODAY AT [facility]:   Refer to the procedure report that was given to you for any specific questions about what was found during the examination.  If the procedure report does not answer your questions, please call your gastroenterologist to clarify.  If you requested that your care partner not be given the details of your procedure findings, then the procedure report has been included in a sealed envelope for you to review at your convenience later.  YOU SHOULD EXPECT: Some feelings of bloating in the abdomen. Passage of more gas than usual.  Walking can help get rid of the air that was put into your GI tract during the procedure and reduce the bloating. If you had a lower endoscopy (such as a colonoscopy or flexible sigmoidoscopy) you may notice spotting of blood in your stool or on the toilet paper. If you underwent a bowel prep for your procedure, you may not have a normal bowel movement for a few days.  Please Note:  You might notice some irritation and congestion in your nose or some drainage.  This is from the oxygen used during your procedure.  There is no need for concern and it should clear up in a day or so.  SYMPTOMS TO REPORT IMMEDIATELY:  Following lower endoscopy (colonoscopy or flexible sigmoidoscopy):  Excessive amounts of blood in the stool  Significant tenderness or worsening of abdominal pains  Swelling of the abdomen that is new, acute  Fever of 100F or higher  For urgent or emergent issues, a gastroenterologist can be reached at any hour by calling [phone]. Do not use MyChart messaging for urgent concerns.    DIET:  We do recommend a small meal at first, but then you may proceed to your  regular diet.  Drink plenty of fluids but you should avoid alcoholic beverages for 24 hours.  ACTIVITY:  You should plan to take it easy for the rest of today and you should NOT DRIVE or use heavy machinery until tomorrow (because of the sedation medicines used during the test).    FOLLOW UP: Our staff will call the number listed on your records 48-72 hours following your procedure to check on you and address any questions or concerns that you may have regarding the information given to you following your procedure. If we do not reach you, we will leave a message.  We will attempt to reach you two times.  During this call, we will ask if you have developed any symptoms of COVID 19. If you develop any symptoms (ie: fever, flu-like symptoms, shortness of breath, cough etc.) before then, please call [phone].  If you test positive for Covid 19 in the 2 weeks post procedure, please call and report this information to [place].    If any biopsies were taken you will be contacted by phone or by letter within the next 1-3 weeks.  Please call us at [phone] if you have not heard about the biopsies in 3 weeks.    SIGNATURES/CONFIDENTIALITY: You and/or your care partner have signed paperwork which will be entered into your electronic medical record.  These signatures attest to the fact that that the information above on your After Visit Summary has been reviewed and is understood.  Full responsibility  of the confidentiality of this discharge information lies with you and/or your care-partner.

## 2021-01-27 ENCOUNTER — Telehealth: Payer: Self-pay

## 2021-01-27 NOTE — Telephone Encounter (Signed)
Called # [phone] and left a message we tried to reach pt for a follow up call. maw

## 2021-01-27 NOTE — Telephone Encounter (Signed)
Left message on follow up call. 

## 2021-01-31 NOTE — Progress Notes (Signed)
Mr. Grant Hernandez,  Good news: the polyp (or polyps) that I removed during your recent examination were NOT precancerous.  You should continue to follow current colorectal cancer screening guidelines with a repeat colonoscopy in 10 years.    If you develop any new rectal bleeding, abdominal pain or significant bowel habit changes, please contact me before then.   

## 2021-03-04 ENCOUNTER — Other Ambulatory Visit (HOSPITAL_BASED_OUTPATIENT_CLINIC_OR_DEPARTMENT_OTHER): Payer: Self-pay

## 2021-03-04 ENCOUNTER — Encounter (HOSPITAL_BASED_OUTPATIENT_CLINIC_OR_DEPARTMENT_OTHER): Payer: Self-pay | Admitting: Pharmacist

## 2021-03-04 ENCOUNTER — Other Ambulatory Visit: Payer: Self-pay | Admitting: Nurse Practitioner

## 2021-03-04 ENCOUNTER — Encounter (HOSPITAL_BASED_OUTPATIENT_CLINIC_OR_DEPARTMENT_OTHER): Payer: Self-pay

## 2021-03-04 DIAGNOSIS — R030 Elevated blood-pressure reading, without diagnosis of hypertension: Secondary | ICD-10-CM

## 2021-03-04 MED ORDER — LISINOPRIL 2.5 MG PO TABS
2.5000 mg | ORAL_TABLET | Freq: Every day | ORAL | 1 refills | Status: DC
  Filled 2021-03-04: qty 90, 90d supply, fill #0
  Filled 2021-05-31: qty 90, 90d supply, fill #1

## 2021-03-05 ENCOUNTER — Other Ambulatory Visit (HOSPITAL_BASED_OUTPATIENT_CLINIC_OR_DEPARTMENT_OTHER): Payer: Self-pay

## 2021-03-19 ENCOUNTER — Ambulatory Visit (INDEPENDENT_AMBULATORY_CARE_PROVIDER_SITE_OTHER): Payer: No Typology Code available for payment source | Admitting: Nurse Practitioner

## 2021-03-19 ENCOUNTER — Other Ambulatory Visit: Payer: Self-pay

## 2021-03-19 ENCOUNTER — Encounter: Payer: Self-pay | Admitting: Nurse Practitioner

## 2021-03-19 VITALS — BP 130/80 | HR 68 | Temp 98.3°F | Ht 70.0 in | Wt 301.0 lb

## 2021-03-19 DIAGNOSIS — Z6841 Body Mass Index (BMI) 40.0 and over, adult: Secondary | ICD-10-CM

## 2021-03-19 DIAGNOSIS — G7000 Myasthenia gravis without (acute) exacerbation: Secondary | ICD-10-CM

## 2021-03-19 DIAGNOSIS — R7303 Prediabetes: Secondary | ICD-10-CM

## 2021-03-19 DIAGNOSIS — Z23 Encounter for immunization: Secondary | ICD-10-CM | POA: Diagnosis not present

## 2021-03-19 DIAGNOSIS — R030 Elevated blood-pressure reading, without diagnosis of hypertension: Secondary | ICD-10-CM

## 2021-03-19 DIAGNOSIS — E782 Mixed hyperlipidemia: Secondary | ICD-10-CM

## 2021-03-19 LAB — POCT URINALYSIS DIPSTICK
Bilirubin, UA: NEGATIVE
Blood, UA: NEGATIVE
Glucose, UA: NEGATIVE
Ketones, UA: NEGATIVE
Leukocytes, UA: NEGATIVE
Nitrite, UA: NEGATIVE
Protein, UA: NEGATIVE
Spec Grav, UA: 1.025 (ref 1.010–1.025)
Urobilinogen, UA: 0.2 E.U./dL
pH, UA: 5.5 (ref 5.0–8.0)

## 2021-03-19 NOTE — Progress Notes (Signed)
I,Yamilka J Llittleton,acting as a Education administrator for [employer], FNP.,have documented all relevant documentation on the behalf of [name], FNP,as directed by  [name], FNP while in the presence of [name], [place].   This visit occurred during the SARS-CoV-2 public health emergency.  Safety protocols were in place, including screening questions prior to the visit, additional usage of staff PPE, and extensive cleaning of exam room while observing appropriate contact time as indicated for disinfecting solutions.  Subjective:     Patient ID: Grant Hernandez , male    DOB: 1975/08/18 , 46 y.o.   MRN: [id number]   Chief Complaint  Patient presents with   Prediabetes   Hypertension    HPI  Patient here for a f/u on his prediabetes and bp. He has not been exercising regularly and not eating as well due to the [place]. This week is the first week he is getting back on track with his eating. Admits to eating an increased amounts of fatty foods. He drinks approximately 56 oz water a day. Does not drink coffee or sodas.   Wt Readings from Last 3 Encounters: 03/19/21 : (!) 301 lb (136.5 kg) 01/23/21 : 290 lb (131.5 kg) 01/01/21 : 290 lb (131.5 kg)    Hypertension This is a chronic problem. The current episode started more than 1 year ago. The problem is unchanged. The problem is controlled. Pertinent negatives include no anxiety, chest pain, headaches, palpitations or shortness of breath. There are no associated agents to hypertension. Risk factors for coronary artery disease include obesity, sedentary lifestyle and male gender. Past treatments include ACE inhibitors. The current treatment provides significant improvement. There are no compliance problems.  There is no history of angina. There is no history of chronic renal disease.  Hyperlipidemia This is a chronic problem. The problem is controlled. Recent lipid tests were reviewed and are low. He has no history of chronic renal disease.  There are no known factors aggravating his hyperlipidemia. Pertinent negatives include no chest pain or shortness of breath. Current antihyperlipidemic treatment includes diet change and exercise. There are no compliance problems.  Risk factors for coronary artery disease include male sex, obesity and a sedentary lifestyle.    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, also trouble urinating post op   Diabetes mellitus without complication ([place])    Hypertension    MRSA (methicillin resistant Staphylococcus aureus) 2010   right axilla   Myasthenia gravis ([place])    Pectoralis muscle rupture      Family History  Problem Relation Age of Onset   Healthy Mother    Other Father        unknown   Colon cancer Neg Hx    Esophageal cancer Neg Hx    Stomach cancer Neg Hx    Rectal cancer Neg Hx      Current Outpatient Medications:    Cholecalciferol (VITAMIN D3) 1.25 MG (50000 UT) CAPS, TAKE 1 CAPSULE BY MOUTH ON TUESDAY AND FRIDAY, Disp: 24 capsule, Rfl: 1   clomiPHENE (CLOMID) 50 MG tablet, Take 1/2 tablet by mouth once daily, Disp: 15 tablet, Rfl: 3   lisinopril (ZESTRIL) 2.5 MG tablet, Take 1 tablet (2.5 mg total) by mouth daily., Disp: 90 tablet, Rfl: 1   metFORMIN (GLUCOPHAGE) 500 MG tablet, TAKE 1 TABLET BY MOUTH DAILY WITH BREAKFAST, Disp: 90 tablet, Rfl: 1   mycophenolate (CELLCEPT) 250 MG capsule,  Take 1 capsule (250 mg total) by mouth 2 (two) times daily, take with two 500 mg tablets twice daily for total of 1250 mg twice daily, Disp: 180 capsule, Rfl: 3   mycophenolate (CELLCEPT) 500 MG tablet, Take 2 tablets (1,000 mg total) by mouth 2 (two) times daily Take with 250 mg twice daily for total of 1250 mg twice daily, Disp: 360 tablet, Rfl: 3   pyridostigmine (MESTINON) 60 MG tablet, TAKE 1 AND 1/2 TABLETS BY MOUTH THREE TIMES DAILY, Disp: 405 tablet, Rfl: 3   No Known Allergies   Review of Systems   Respiratory:  Negative for shortness of breath.   Cardiovascular:  Negative for chest pain and palpitations.  Neurological:  Negative for headaches.    Today's Vitals   03/19/21 1005  BP: (!) 136/98  Pulse: 68  Temp: 98.3 F (36.8 C)  Weight: (!) 301 lb (136.5 kg)  Height: _0  (1.778 m)  PainSc: 0-No pain   Body mass index is 43.19 kg/m.   Objective:  Physical Exam Vitals reviewed.  Constitutional:      General: He is not in acute distress.    Appearance: Normal appearance. He is obese.  HENT:     Head: Normocephalic.  Cardiovascular:     Rate and Rhythm: Normal rate and regular rhythm.     Pulses: Normal pulses.     Heart sounds: Normal heart sounds. No murmur heard. Pulmonary:     Effort: Pulmonary effort is normal. No respiratory distress.     Breath sounds: Normal breath sounds. No wheezing.  Skin:    General: Skin is warm and dry.     Findings: No rash.  Neurological:     General: No focal deficit present.     Mental Status: He is alert and oriented to person, place, and time.     Cranial Nerves: No cranial nerve deficit.  Psychiatric:        Mood and Affect: Mood normal.        Behavior: Behavior normal.        Thought Content: Thought content normal.        Judgment: Judgment normal.        Assessment And Plan:     1. Prediabetes Comments: There was an improvement of his HgbA1c, will recheck today. Tolerating metformin well - POCT Urinalysis Dipstick (81002) - Microalbumin / Creatinine Urine Ratio - Hemoglobin A1c - BMP8+eGFR  2. essential hypertension Comments: Blood pressure is elevated today, repeat was - BMP8+eGFR  3. Mixed hyperlipidemia Comments: Stable, no medications this is diet controlled. He does admit to eating more fried and fatty foods over the Holiday season - Lipid panel - BMP8+eGFR  4. Class 3 severe obesity due to excess calories without serious comorbidity with body mass index (BMI) of 40.0 to 44.9 in adult  [facility]) Chronic, he has had an 11 lb weight gain since his last visit Strongly encouraged to healthy diet and regular exercise options  Encouraged to exercise at least 150 minutes per week with 2 days of strength training  5. Immunization due - Pneumococcal conjugate vaccine 20-valent (Prevnar-20)     Patient was given opportunity to ask questions. Patient verbalized understanding of the plan and was able to repeat key elements of the plan. All questions were answered to their satisfaction.  [name], FNP   I, [name], FNP, have reviewed all documentation for this visit. The documentation on 03/19/21 for the exam, diagnosis, procedures, and orders are all accurate  and complete.   IF YOU HAVE BEEN REFERRED TO A SPECIALIST, IT MAY TAKE 1-2 WEEKS TO SCHEDULE/PROCESS THE REFERRAL. IF YOU HAVE NOT HEARD FROM US/SPECIALIST IN TWO WEEKS, PLEASE GIVE [place] A CALL AT [phone].   THE PATIENT IS ENCOURAGED TO PRACTICE SOCIAL DISTANCING DUE TO THE COVID-19 PANDEMIC.

## 2021-03-19 NOTE — Patient Instructions (Signed)

## 2021-03-20 LAB — LIPID PANEL
Chol/HDL Ratio: 3.9 ratio (ref 0.0–5.0)
Cholesterol, Total: 149 mg/dL (ref 100–199)
HDL: 38 mg/dL — ABNORMAL LOW (ref >39)
LDL Chol Calc (NIH): 97 mg/dL (ref 0–99)
Triglycerides: 68 mg/dL (ref 0–149)
VLDL Cholesterol Cal: 14 mg/dL (ref 5–40)

## 2021-03-20 LAB — BMP8+EGFR
BUN/Creatinine Ratio: 16 (ref 9–20)
BUN: 20 mg/dL (ref 6–24)
CO2: 24 mmol/L (ref 20–29)
Calcium: 9.9 mg/dL (ref 8.7–10.2)
Chloride: 102 mmol/L (ref 96–106)
Creatinine, Ser: 1.26 mg/dL (ref 0.76–1.27)
Glucose: 83 mg/dL (ref 70–99)
Potassium: 4.7 mmol/L (ref 3.5–5.2)
Sodium: 140 mmol/L (ref 134–144)
eGFR: 71 mL/min/{1.73_m2} (ref >59)

## 2021-03-20 LAB — HEMOGLOBIN A1C
Est. average glucose Bld gHb Est-mCnc: 137 mg/dL
Hgb A1c MFr Bld: 6.4 % — ABNORMAL HIGH (ref 4.8–5.6)

## 2021-03-20 LAB — MICROALBUMIN / CREATININE URINE RATIO
Creatinine, Urine: 110.6 mg/dL
Microalb/Creat Ratio: <3 mg/g creat (ref 0–29)
Microalbumin, Urine: <3 ug/mL

## 2021-04-02 ENCOUNTER — Other Ambulatory Visit (HOSPITAL_BASED_OUTPATIENT_CLINIC_OR_DEPARTMENT_OTHER): Payer: Self-pay

## 2021-04-09 ENCOUNTER — Encounter (HOSPITAL_BASED_OUTPATIENT_CLINIC_OR_DEPARTMENT_OTHER): Payer: Self-pay | Admitting: Pharmacist

## 2021-04-09 ENCOUNTER — Other Ambulatory Visit (HOSPITAL_BASED_OUTPATIENT_CLINIC_OR_DEPARTMENT_OTHER): Payer: Self-pay

## 2021-04-09 ENCOUNTER — Other Ambulatory Visit: Payer: Self-pay

## 2021-04-09 MED ORDER — PYRIDOSTIGMINE BROMIDE 60 MG PO TABS
ORAL_TABLET | ORAL | 3 refills | Status: DC
  Filled 2021-04-09: qty 405, 90d supply, fill #0
  Filled 2021-07-20: qty 405, 90d supply, fill #1
  Filled 2021-11-26: qty 405, 90d supply, fill #2

## 2021-04-10 ENCOUNTER — Other Ambulatory Visit (HOSPITAL_BASED_OUTPATIENT_CLINIC_OR_DEPARTMENT_OTHER): Payer: Self-pay

## 2021-04-20 ENCOUNTER — Other Ambulatory Visit (HOSPITAL_BASED_OUTPATIENT_CLINIC_OR_DEPARTMENT_OTHER): Payer: Self-pay

## 2021-04-21 ENCOUNTER — Other Ambulatory Visit (HOSPITAL_BASED_OUTPATIENT_CLINIC_OR_DEPARTMENT_OTHER): Payer: Self-pay

## 2021-06-01 ENCOUNTER — Other Ambulatory Visit (HOSPITAL_BASED_OUTPATIENT_CLINIC_OR_DEPARTMENT_OTHER): Payer: Self-pay

## 2021-06-08 ENCOUNTER — Ambulatory Visit (HOSPITAL_COMMUNITY)
Admission: RE | Admit: 2021-06-08 | Discharge: 2021-06-08 | Disposition: A | Discharge status: Home / Self Care | Payer: No Typology Code available for payment source | Source: Ambulatory Visit | Attending: Physician Assistant | Admitting: Physician Assistant

## 2021-06-08 ENCOUNTER — Other Ambulatory Visit: Payer: Self-pay

## 2021-06-08 ENCOUNTER — Encounter (HOSPITAL_COMMUNITY): Payer: Self-pay

## 2021-06-08 VITALS — BP 143/86 | HR 67 | Temp 98.2°F | Resp 18

## 2021-06-08 DIAGNOSIS — L0291 Cutaneous abscess, unspecified: Secondary | ICD-10-CM

## 2021-06-08 DIAGNOSIS — L02413 Cutaneous abscess of right upper limb: Secondary | ICD-10-CM | POA: Diagnosis not present

## 2021-06-08 MED ORDER — LIDOCAINE-EPINEPHRINE 1 %-1:100000 IJ SOLN
INTRAMUSCULAR | Status: AC
  Filled 2021-06-08: qty 1

## 2021-06-08 NOTE — Discharge Instructions (Signed)
Continue with warm compress.   ?Return if no improvement or symptoms become worse.  ?

## 2021-06-08 NOTE — ED Triage Notes (Signed)
T has an abscess on Rt upper arm.Skin is red and swollen. ?

## 2021-06-08 NOTE — ED Provider Notes (Signed)
?[facility] ? ? ? ?CSN: [id number] ?Arrival date & time: 06/08/21  1830 ? ? ?  ? ?History   ?Chief Complaint ?Chief Complaint  ?Patient presents with  ? Abscess  ?  Entered by patient  ? ? ?HPI ?Grant Hernandez is a 46 y.o. male.  ? ?Pt complains of swelling and redness to right upper inner arm that started several days ago.  He has applied warm compress with minimal relief. Denies drainage, fever, chills.   ? ? ?Past Medical History:  ?Diagnosis Date  ? Complication of anesthesia   ? 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 Diabetes mellitus without complication ([place])   ? Hypertension   ? MRSA (methicillin resistant Staphylococcus aureus) 2010  ? right axilla  ? Myasthenia gravis ([facility])   ? Pectoralis muscle rupture   ? ? ?Patient Active Problem List  ? Diagnosis Date Noted  ? Mixed hyperlipidemia 02/19/2020  ? Low vitamin D level 11/30/2018  ? Obesity (BMI 35.0-39.9 without comorbidity) 11/30/2018  ? Erectile dysfunction 11/30/2018  ? Encounter for prostate cancer screening 11/30/2018  ? Obstructive sleep apnea syndrome 02/21/2018  ? essential hypertension 02/21/2018  ? Class 3 severe obesity with body mass index (BMI) of 40.0 to 44.9 in adult [facility]) 02/21/2018  ? Myasthenia gravis ([place]) 08/17/2011  ? ? ?Past Surgical History:  ?Procedure Laterality Date  ? PECTORALIS TENDON REPAIR Right 08/12/2016  ? Procedure: RIGHT PECTORALIS TENDON REPAIR;  Surgeon: Marchia Bond, MD;  Location: [facility];  Service: Orthopedics;  Laterality: Right;  ? THYMECTOMY  1998  ? ? ? ? ? ?Home Medications   ? ?Prior to Admission medications   ?Medication Sig Start Date End Date Taking? Authorizing Provider  ?Cholecalciferol (VITAMIN D3) 1.25 MG (50000 UT) CAPS TAKE 1 CAPSULE BY MOUTH ON TUESDAY AND FRIDAY 07/31/20 07/31/21  [name], FNP  ?clomiPHENE (CLOMID) 50 MG tablet Take 1/2 tablet by mouth once daily 10/02/20     ?lisinopril (ZESTRIL) 2.5 MG tablet Take 1  tablet (2.5 mg total) by mouth daily. 03/04/21   [name], FNP  ?metFORMIN (GLUCOPHAGE) 500 MG tablet TAKE 1 TABLET BY MOUTH DAILY WITH BREAKFAST 01/21/21 01/21/22  [name], FNP  ?mycophenolate (CELLCEPT) 250 MG capsule Take 1 capsule (250 mg total) by mouth 2 (two) times daily, take with two 500 mg tablets twice daily for total of 1250 mg twice daily 06/26/20     ?mycophenolate (CELLCEPT) 500 MG tablet Take 2 tablets (1,000 mg total) by mouth 2 (two) times daily Take with 250 mg twice daily for total of 1250 mg twice daily 06/26/20     ?pyridostigmine (MESTINON) 60 MG tablet TAKE 1 AND 1/2 TABLETS BY MOUTH THREE TIMES DAILY 12/26/19 01/28/21  Debbe Mounts, MD  ?pyridostigmine (MESTINON) 60 MG tablet Take 1.5 tablets (90 mg total) by mouth 3 (three) times daily 04/09/21     ? ? ?Family History ?Family History  ?Problem Relation Age of Onset  ? Healthy Mother   ? Other Father   ?     unknown  ? Colon cancer Neg Hx   ? Esophageal cancer Neg Hx   ? Stomach cancer Neg Hx   ? Rectal cancer Neg Hx   ? ? ?Social History ?Social History  ? ?Tobacco Use  ? Smoking status: Never  ? Smokeless tobacco: Never  ?Vaping Use  ? Vaping Use: Never used  ?Substance Use Topics  ? Alcohol use:  Yes  ?  Comment: social  ? Drug use: No  ? ? ? ?Allergies   ?Patient has no known allergies. ? ? ?Review of Systems ?Review of Systems  ?Constitutional:  Positive for fever. Negative for chills.  ?HENT:  Negative for ear pain and sore throat.   ?Eyes:  Negative for pain and visual disturbance.  ?Respiratory:  Negative for cough and shortness of breath.   ?Cardiovascular:  Negative for chest pain and palpitations.  ?Gastrointestinal:  Negative for abdominal pain and vomiting.  ?Genitourinary:  Negative for dysuria and hematuria.  ?Musculoskeletal:  Negative for arthralgias and back pain.  ?Skin:  Negative for color change and rash.  ?     Swelling and redness to inner upper right arm  ?Neurological:  Negative for seizures and syncope.   ?All other systems reviewed and are negative. ? ? ?Physical Exam ?Triage Vital Signs ?ED Triage Vitals  ?Enc Vitals Group  ?   BP 06/08/21 1953 (!) 143/86  ?   Pulse Rate 06/08/21 1953 67  ?   Resp 06/08/21 1953 18  ?   Temp 06/08/21 1953 98.2 ?F (36.8 ?C)  ?   Temp src --   ?   SpO2 06/08/21 1953 96 %  ?   Weight --   ?   Height --   ?   Head Circumference --   ?   Peak Flow --   ?   Pain Score 06/08/21 1951 3  ?   Pain Loc --   ?   Pain Edu? --   ?   Excl. in [place]? --   ? ?No data found. ? ?Updated Vital Signs ?BP (!) 143/86   Pulse 67   Temp 98.2 ?F (36.8 ?C)   Resp 18   SpO2 96%  ? ?Visual Acuity ?Right Eye Distance:   ?Left Eye Distance:   ?Bilateral Distance:   ? ?Right Eye Near:   ?Left Eye Near:    ?Bilateral Near:    ? ?Physical Exam ?Vitals and nursing note reviewed.  ?Constitutional:   ?   General: He is not in acute distress. ?   Appearance: He is well-developed.  ?HENT:  ?   Head: Normocephalic and atraumatic.  ?Eyes:  ?   Conjunctiva/sclera: Conjunctivae normal.  ?Cardiovascular:  ?   Rate and Rhythm: Normal rate and regular rhythm.  ?   Heart sounds: No murmur heard. ?Pulmonary:  ?   Effort: Pulmonary effort is normal. No respiratory distress.  ?   Breath sounds: Normal breath sounds.  ?Abdominal:  ?   Palpations: Abdomen is soft.  ?   Tenderness: There is no abdominal tenderness.  ?Musculoskeletal:     ?   General: No swelling.  ?   Cervical back: Neck supple.  ?Skin: ?   General: Skin is warm and dry.  ?   Capillary Refill: Capillary refill takes less than 2 seconds.  ? ?    ?Neurological:  ?   Mental Status: He is alert.  ?Psychiatric:     ?   Mood and Affect: Mood normal.  ? ? ? ?UC Treatments / Results  ?Labs ?(all labs ordered are listed, but only abnormal results are displayed) ?Labs Reviewed - No data to display ? ?EKG ? ? ?Radiology ?No results found. ? ?Procedures ?Incision and Drainage ? ?Date/Time: 06/08/2021 8:42 PM ?Performed by: Ward, Lenise Arena, PA-C ?Authorized by: Ward, Lenise Arena,  PA-C  ? ?Consent:  ?  Consent obtained:  Verbal ?  Consent given by:  Patient ?  Risks discussed:  Bleeding, incomplete drainage, pain and damage to other organs ?  Alternatives discussed:  No treatment ?Universal protocol:  ?  Procedure explained and questions answered to patient or proxy's satisfaction: yes   ?  Relevant documents present and verified: yes   ?  Test results available : yes   ?  Imaging studies available: yes   ?  Required blood products, implants, devices, and special equipment available: yes   ?  Site/side marked: yes   ?  Immediately prior to procedure, a time out was called: yes   ?  Patient identity confirmed:  Verbally with patient ?Location:  ?  Type:  Abscess ?Pre-procedure details:  ?  Skin preparation:  Betadine ?Anesthesia:  ?  Anesthesia method:  Local infiltration ?  Local anesthetic:  Lidocaine 1% WITH epi ?Procedure type:  ?  Complexity:  Simple ?Procedure details:  ?  Incision types:  Single straight ?  Incision depth:  Subcutaneous ?  Wound management:  Probed and deloculated, irrigated with saline and extensive cleaning ?  Drainage:  Serosanguinous ?  Drainage amount:  Moderate ?  Wound treatment:  Wound left open ?  Packing materials:  1/4 in gauze ?Post-procedure details:  ?  Procedure completion:  Tolerated well, no immediate complications (including critical care time) ? ?Medications Ordered in UC ?Medications - No data to display ? ?Initial Impression / Assessment and Plan / UC Course  ?I have reviewed the triage vital signs and the nursing notes. ? ?Pertinent labs & imaging results that were available during my care of the patient were reviewed by me and considered in my medical decision making (see chart for details). ? ?  ? ?Abscess, I&D done in clinic today.  Packing in place.  Return precautions discussed.  ?Final Clinical Impressions(s) / UC Diagnoses  ? ?Final diagnoses:  ?Abscess  ? ? ? ?Discharge Instructions   ? ?  ?Continue with warm compress.   ?Return if no  improvement or symptoms become worse.  ? ? ? ?ED Prescriptions   ?None ?  ? ?PDMP not reviewed this encounter. ?  ?Ward, Lenise Arena, PA-C ?06/08/21 2042 ? ?

## 2021-07-01 ENCOUNTER — Encounter: Payer: Self-pay | Admitting: Nurse Practitioner

## 2021-07-01 ENCOUNTER — Ambulatory Visit (INDEPENDENT_AMBULATORY_CARE_PROVIDER_SITE_OTHER): Payer: No Typology Code available for payment source | Admitting: Nurse Practitioner

## 2021-07-01 VITALS — BP 128/70 | HR 72 | Temp 98.4°F | Ht 70.0 in | Wt 272.0 lb

## 2021-07-01 DIAGNOSIS — E782 Mixed hyperlipidemia: Secondary | ICD-10-CM

## 2021-07-01 DIAGNOSIS — R030 Elevated blood-pressure reading, without diagnosis of hypertension: Secondary | ICD-10-CM | POA: Diagnosis not present

## 2021-07-01 DIAGNOSIS — Z6839 Body mass index (BMI) 39.0-39.9, adult: Secondary | ICD-10-CM

## 2021-07-01 DIAGNOSIS — R7303 Prediabetes: Secondary | ICD-10-CM

## 2021-07-01 DIAGNOSIS — L0291 Cutaneous abscess, unspecified: Secondary | ICD-10-CM | POA: Diagnosis not present

## 2021-07-01 DIAGNOSIS — G7000 Myasthenia gravis without (acute) exacerbation: Secondary | ICD-10-CM

## 2021-07-01 NOTE — Patient Instructions (Signed)
Hypertension, Adult High blood pressure (hypertension) is when the force of blood pumping through the arteries is too strong. The arteries are the blood vessels that carry blood from the heart throughout the body. Hypertension forces the heart to work harder to pump blood and may cause arteries to become narrow or stiff. Untreated or uncontrolled hypertension can lead to a heart attack, heart failure, a stroke, kidney disease, and other problems. A blood pressure reading consists of a higher number over a lower number. Ideally, your blood pressure should be below 120/80. The first ("top") number is called the systolic pressure. It is a measure of the pressure in your arteries as your heart beats. The second ("bottom") number is called the diastolic pressure. It is a measure of the pressure in your arteries as the heart relaxes. What are the causes? The exact cause of this condition is not known. There are some conditions that result in high blood pressure. What increases the risk? Certain factors may make you more likely to develop high blood pressure. Some of these risk factors are under your control, including: Smoking. Not getting enough exercise or physical activity. Being overweight. Having too much fat, sugar, calories, or salt (sodium) in your diet. Drinking too much alcohol. Other risk factors include: Having a personal history of heart disease, diabetes, high cholesterol, or kidney disease. Stress. Having a family history of high blood pressure and high cholesterol. Having obstructive sleep apnea. Age. The risk increases with age. What are the signs or symptoms? High blood pressure may not cause symptoms. Very high blood pressure (hypertensive crisis) may cause: Headache. Fast or irregular heartbeats (palpitations). Shortness of breath. Nosebleed. Nausea and vomiting. Vision changes. Severe chest pain, dizziness, and seizures. How is this diagnosed? This condition is diagnosed by  measuring your blood pressure while you are seated, with your arm resting on a flat surface, your legs uncrossed, and your feet flat on the floor. The cuff of the blood pressure monitor will be placed directly against the skin of your upper arm at the level of your heart. Blood pressure should be measured at least twice using the same arm. Certain conditions can cause a difference in blood pressure between your right and left arms. If you have a high blood pressure reading during one visit or you have normal blood pressure with other risk factors, you may be asked to: Return on a different day to have your blood pressure checked again. Monitor your blood pressure at home for 1 week or longer. If you are diagnosed with hypertension, you may have other blood or imaging tests to help your health care provider understand your overall risk for other conditions. How is this treated? This condition is treated by making healthy lifestyle changes, such as eating healthy foods, exercising more, and reducing your alcohol intake. You may be referred for counseling on a healthy diet and physical activity. Your health care provider may prescribe medicine if lifestyle changes are not enough to get your blood pressure under control and if: Your systolic blood pressure is above 130. Your diastolic blood pressure is above 80. Your personal target blood pressure may vary depending on your medical conditions, your age, and other factors. Follow these instructions at home: Eating and drinking  Eat a diet that is high in fiber and potassium, and low in sodium, added sugar, and fat. An example of this eating plan is called the DASH diet. DASH stands for Dietary Approaches to Stop Hypertension. To eat this way: Eat   plenty of fresh fruits and vegetables. Try to fill one half of your plate at each meal with fruits and vegetables. Eat whole grains, such as whole-wheat pasta, Parcher rice, or whole-grain bread. Fill about one  fourth of your plate with whole grains. Eat or drink low-fat dairy products, such as skim milk or low-fat yogurt. Avoid fatty cuts of meat, processed or cured meats, and poultry with skin. Fill about one fourth of your plate with lean proteins, such as fish, chicken without skin, beans, eggs, or tofu. Avoid pre-made and processed foods. These tend to be higher in sodium, added sugar, and fat. Reduce your daily sodium intake. Many people with hypertension should eat less than 1,500 mg of sodium a day. Do not drink alcohol if: Your health care provider tells you not to drink. You are pregnant, may be pregnant, or are planning to become pregnant. If you drink alcohol: Limit how much you have to: 0-1 drink a day for women. 0-2 drinks a day for men. Know how much alcohol is in your drink. In the [place] one drink equals one 12 oz bottle of beer (355 mL), one 5 oz glass of wine (148 mL), or one 1 oz glass of hard liquor (44 mL). Lifestyle  Work with your health care provider to maintain a healthy body weight or to lose weight. Ask what an ideal weight is for you. Get at least 30 minutes of exercise that causes your heart to beat faster (aerobic exercise) most days of the week. Activities may include walking, swimming, or biking. Include exercise to strengthen your muscles (resistance exercise), such as Pilates or lifting weights, as part of your weekly exercise routine. Try to do these types of exercises for 30 minutes at least 3 days a week. Do not use any products that contain nicotine or tobacco. These products include cigarettes, chewing tobacco, and vaping devices, such as e-cigarettes. If you need help quitting, ask your health care provider. Monitor your blood pressure at home as told by your health care provider. Keep all follow-up visits. This is important. Medicines Take over-the-counter and prescription medicines only as told by your health care provider. Follow directions carefully. Blood  pressure medicines must be taken as prescribed. Do not skip doses of blood pressure medicine. Doing this puts you at risk for problems and can make the medicine less effective. Ask your health care provider about side effects or reactions to medicines that you should watch for. Contact a health care provider if you: Think you are having a reaction to a medicine you are taking. Have headaches that keep coming back (recurring). Feel dizzy. Have swelling in your ankles. Have trouble with your vision. Get help right away if you: Develop a severe headache or confusion. Have unusual weakness or numbness. Feel faint. Have severe pain in your chest or abdomen. Vomit repeatedly. Have trouble breathing. These symptoms may be an emergency. Get help right away. Call 911. Do not wait to see if the symptoms will go away. Do not drive yourself to the hospital. Summary Hypertension is when the force of blood pumping through your arteries is too strong. If this condition is not controlled, it may put you at risk for serious complications. Your personal target blood pressure may vary depending on your medical conditions, your age, and other factors. For most people, a normal blood pressure is less than 120/80. Hypertension is treated with lifestyle changes, medicines, or a combination of both. Lifestyle changes include losing weight, eating a healthy,   low-sodium diet, exercising more, and limiting alcohol. This information is not intended to replace advice given to you by your health care provider. Make sure you discuss any questions you have with your health care provider. Document Revised: 12/30/2020 Document Reviewed: 12/30/2020 Elsevier Patient Education  2023 Elsevier Inc.  

## 2021-07-01 NOTE — Progress Notes (Signed)
?Industrial/product designer as a Education administrator for [employer], FNP.,have documented all relevant documentation on the behalf of [name], FNP,as directed by  [name], FNP while in the presence of [name], [place]. ? ?This visit occurred during the SARS-CoV-2 public health emergency.  Safety protocols were in place, including screening questions prior to the visit, additional usage of staff PPE, and extensive cleaning of exam room while observing appropriate contact time as indicated for disinfecting solutions. ? ?Subjective:  ?  ? Patient ID: Grant Hernandez , male    DOB: 21-Jan-1976 , 47 y.o.   MRN: [id number] ? ? ?Chief Complaint  ?Patient presents with  ? Hypertension  ? ? ?HPI ? ?Patient here for a f/u on his prediabetes and bp. Tolerating metformin well. He went to Urgent Care to have abcess drained. He feels it is improving.  He did not get an antibiotic.  ? ?Hypertension ?This is a chronic problem. The current episode started more than 1 year ago. The problem is unchanged. The problem is controlled. Pertinent negatives include no anxiety, chest pain, headaches, palpitations or shortness of breath. There are no associated agents to hypertension. Risk factors for coronary artery disease include obesity, sedentary lifestyle and male gender. Past treatments include ACE inhibitors. The current treatment provides significant improvement. There are no compliance problems.  There is no history of angina. There is no history of chronic renal disease.  ?Hyperlipidemia ?This is a chronic problem. The problem is controlled. Recent lipid tests were reviewed and are low. He has no history of chronic renal disease. There are no known factors aggravating his hyperlipidemia. Pertinent negatives include no chest pain or shortness of breath. Current antihyperlipidemic treatment includes diet change and exercise. There are no compliance problems.  Risk factors for coronary artery disease include male sex, obesity and a sedentary  lifestyle.   ? ?Past Medical History:  ?Diagnosis Date  ? Complication of anesthesia   ? 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 Diabetes mellitus without complication ([place])   ? Hypertension   ? MRSA (methicillin resistant Staphylococcus aureus) 2010  ? right axilla  ? Myasthenia gravis ([place])   ? Pectoralis muscle rupture   ?  ? ?Family History  ?Problem Relation Age of Onset  ? Healthy Mother   ? Other Father   ?     unknown  ? Colon cancer Neg Hx   ? Esophageal cancer Neg Hx   ? Stomach cancer Neg Hx   ? Rectal cancer Neg Hx   ? ? ? ?Current Outpatient Medications:  ?  Cholecalciferol (VITAMIN D3) 1.25 MG (50000 UT) CAPS, TAKE 1 CAPSULE BY MOUTH ON TUESDAY AND FRIDAY, Disp: 24 capsule, Rfl: 1 ?  clomiPHENE (CLOMID) 50 MG tablet, Take 1/2 tablet by mouth once daily, Disp: 15 tablet, Rfl: 3 ?  lisinopril (ZESTRIL) 2.5 MG tablet, Take 1 tablet (2.5 mg total) by mouth daily., Disp: 90 tablet, Rfl: 1 ?  metFORMIN (GLUCOPHAGE) 500 MG tablet, TAKE 1 TABLET BY MOUTH DAILY WITH BREAKFAST, Disp: 90 tablet, Rfl: 1 ?  mycophenolate (CELLCEPT) 250 MG capsule, Take 1 capsule (250 mg total) by mouth 2 (two) times daily, take with two 500 mg tablets twice daily for total of 1250 mg twice daily, Disp: 180 capsule, Rfl: 3 ?  mycophenolate (CELLCEPT) 500 MG tablet, Take 2 tablets (1,000 mg total) by mouth 2 (two) times daily Take with 250 mg twice daily for total of 1250 mg  twice daily, Disp: 360 tablet, Rfl: 3 ?  pyridostigmine (MESTINON) 60 MG tablet, TAKE 1 AND 1/2 TABLETS BY MOUTH THREE TIMES DAILY, Disp: 405 tablet, Rfl: 3 ?  pyridostigmine (MESTINON) 60 MG tablet, Take 1.5 tablets (90 mg total) by mouth 3 (three) times daily, Disp: 405 tablet, Rfl: 3  ? ?No Known Allergies  ? ?Review of Systems  ?Constitutional: Negative.   ?Respiratory: Negative.  Negative for shortness of breath.   ?Cardiovascular: Negative.  Negative for chest pain and palpitations.  ?Gastrointestinal:  Negative.   ?Neurological: Negative.  Negative for headaches.  ?Psychiatric/Behavioral: Negative.     ? ?Today's Vitals  ? 07/01/21 0847  ?BP: 128/70  ?Pulse: 72  ?Temp: 98.4 ?F (36.9 ?C)  ?TempSrc: Oral  ?Weight: 272 lb (123.4 kg)  ?Height: '5\' 10"'  (1.778 m)  ? ?Body mass index is 39.03 kg/m?.  ?Wt Readings from Last 3 Encounters:  ?07/01/21 272 lb (123.4 kg)  ?03/19/21 (!) 301 lb (136.5 kg)  ?01/23/21 290 lb (131.5 kg)  ? ? ?Objective:  ?Physical Exam ?Vitals reviewed.  ?Constitutional:   ?   General: He is not in acute distress. ?   Appearance: Normal appearance. He is obese.  ?HENT:  ?   Head: Normocephalic.  ?Cardiovascular:  ?   Rate and Rhythm: Normal rate and regular rhythm.  ?   Pulses: Normal pulses.  ?   Heart sounds: Normal heart sounds. No murmur heard. ?Pulmonary:  ?   Effort: Pulmonary effort is normal. No respiratory distress.  ?   Breath sounds: Normal breath sounds. No wheezing.  ?Skin: ?   General: Skin is warm and dry.  ?   Findings: No rash.  ?   Comments: Right medial upper arm with healing abscess, has semi firm area approximately 5cm x 5cm with a soft center  ?Neurological:  ?   General: No focal deficit present.  ?   Mental Status: He is alert and oriented to person, place, and time.  ?   Cranial Nerves: No cranial nerve deficit.  ?Psychiatric:     ?   Mood and Affect: Mood normal.     ?   Behavior: Behavior normal.     ?   Thought Content: Thought content normal.     ?   Judgment: Judgment normal.  ?  ? ?   ?Assessment And Plan:  ?   ?1. essential hypertension ?Comments: Blood pressure is well controlled, continue current medications ?- BMP8+EGFR ? ?2. Mixed hyperlipidemia ?Comments: cholesterol levels are controlled at this time, HDL is slightly low encouraged to eat good fats. ?- Lipid panel ? ?3. Prediabetes ?Comments: Slightly worse at last visit, continue metformin ?- Hemoglobin A1c ?- BMP8+EGFR ? ?4. Abscess ?Comments: He reports this has improved, but still has semi firm area  approximately 5cm x 5cm with a soft center. Continue with warm compresses. Will consult with pharmaci ?- CBC with Differential/Platelet ? ?5. Myasthenia gravis ([place]) ?Comments: Stable, continue follow up with Neurology ? ?6. Class 2 severe obesity due to excess calories with serious comorbidity and body mass index (BMI) of 39.0 to 39.9 in adult [facility]) ? He is encouraged to initially strive for BMI less than 30 to decrease cardiac risk. He is advised to exercise no less than 150 minutes per week.  ? ? ? ?Patient was given opportunity to ask questions. Patient verbalized understanding of the plan and was able to repeat key elements of the plan. All questions were answered to their satisfaction.  ?  [name], FNP  ? ?I, [name], FNP, have reviewed all documentation for this visit. The documentation on 07/01/21 for the exam, diagnosis, procedures, and orders are all accurate and complete.  ? ?IF YOU HAVE BEEN REFERRED TO A SPECIALIST, IT MAY TAKE 1-2 WEEKS TO SCHEDULE/PROCESS THE REFERRAL. IF YOU HAVE NOT HEARD FROM US/SPECIALIST IN TWO WEEKS, PLEASE GIVE [place] A CALL AT [phone].  ? ?THE PATIENT IS ENCOURAGED TO PRACTICE SOCIAL DISTANCING DUE TO THE COVID-19 PANDEMIC.   ?

## 2021-07-02 LAB — CBC WITH DIFFERENTIAL/PLATELET
Basophils Absolute: 0.1 10*3/uL (ref 0.0–0.2)
Basos: 2 %
EOS (ABSOLUTE): 0.1 10*3/uL (ref 0.0–0.4)
Eos: 2 %
Hematocrit: 44.6 % (ref 37.5–51.0)
Hemoglobin: 14.3 g/dL (ref 13.0–17.7)
Immature Grans (Abs): 0 10*3/uL (ref 0.0–0.1)
Immature Granulocytes: 0 %
Lymphocytes Absolute: 1.3 10*3/uL (ref 0.7–3.1)
Lymphs: 32 %
MCH: 26.7 pg (ref 26.6–33.0)
MCHC: 32.1 g/dL (ref 31.5–35.7)
MCV: 83 fL (ref 79–97)
Monocytes Absolute: 0.4 10*3/uL (ref 0.1–0.9)
Monocytes: 9 %
Neutrophils Absolute: 2.3 10*3/uL (ref 1.4–7.0)
Neutrophils: 55 %
Platelets: 240 10*3/uL (ref 150–450)
RBC: 5.35 x10E6/uL (ref 4.14–5.80)
RDW: 13.6 % (ref 11.6–15.4)
WBC: 4.1 10*3/uL (ref 3.4–10.8)

## 2021-07-02 LAB — LIPID PANEL
Chol/HDL Ratio: 3.3 ratio (ref 0.0–5.0)
Cholesterol, Total: 143 mg/dL (ref 100–199)
HDL: 43 mg/dL (ref >39)
LDL Chol Calc (NIH): 88 mg/dL (ref 0–99)
Triglycerides: 54 mg/dL (ref 0–149)
VLDL Cholesterol Cal: 12 mg/dL (ref 5–40)

## 2021-07-02 LAB — BMP8+EGFR
BUN/Creatinine Ratio: 14 (ref 9–20)
BUN: 17 mg/dL (ref 6–24)
CO2: 23 mmol/L (ref 20–29)
Calcium: 10.1 mg/dL (ref 8.7–10.2)
Chloride: 102 mmol/L (ref 96–106)
Creatinine, Ser: 1.24 mg/dL (ref 0.76–1.27)
Glucose: 89 mg/dL (ref 70–99)
Potassium: 5 mmol/L (ref 3.5–5.2)
Sodium: 141 mmol/L (ref 134–144)
eGFR: 72 mL/min/{1.73_m2} (ref >59)

## 2021-07-02 LAB — HEMOGLOBIN A1C
Est. average glucose Bld gHb Est-mCnc: 126 mg/dL
Hgb A1c MFr Bld: 6 % — ABNORMAL HIGH (ref 4.8–5.6)

## 2021-07-20 ENCOUNTER — Other Ambulatory Visit: Payer: Self-pay | Admitting: Nurse Practitioner

## 2021-07-20 ENCOUNTER — Encounter (HOSPITAL_BASED_OUTPATIENT_CLINIC_OR_DEPARTMENT_OTHER): Payer: Self-pay

## 2021-07-20 ENCOUNTER — Other Ambulatory Visit (HOSPITAL_BASED_OUTPATIENT_CLINIC_OR_DEPARTMENT_OTHER): Payer: Self-pay

## 2021-07-20 DIAGNOSIS — R7989 Other specified abnormal findings of blood chemistry: Secondary | ICD-10-CM

## 2021-07-20 MED ORDER — VITAMIN D3 1.25 MG (50000 UT) PO CAPS
ORAL_CAPSULE | ORAL | 1 refills | Status: DC
  Filled 2021-07-20: qty 24, 84d supply, fill #0
  Filled 2021-11-26: qty 24, 84d supply, fill #1

## 2021-07-21 ENCOUNTER — Other Ambulatory Visit (HOSPITAL_BASED_OUTPATIENT_CLINIC_OR_DEPARTMENT_OTHER): Payer: Self-pay

## 2021-07-29 ENCOUNTER — Other Ambulatory Visit (HOSPITAL_BASED_OUTPATIENT_CLINIC_OR_DEPARTMENT_OTHER): Payer: Self-pay

## 2021-07-29 MED ORDER — PREDNISONE 5 MG PO TABS
ORAL_TABLET | ORAL | 3 refills | Status: DC
  Filled 2021-07-29: qty 36, 90d supply, fill #0
  Filled 2021-11-05: qty 36, 90d supply, fill #1

## 2021-07-29 MED ORDER — MYCOPHENOLATE MOFETIL 250 MG PO CAPS
ORAL_CAPSULE | ORAL | 3 refills | Status: DC
  Filled 2021-07-29: qty 180, 90d supply, fill #0
  Filled 2021-11-05: qty 180, 90d supply, fill #1

## 2021-07-29 MED ORDER — MYCOPHENOLATE MOFETIL 500 MG PO TABS
ORAL_TABLET | ORAL | 3 refills | Status: DC
  Filled 2021-07-29: qty 360, 90d supply, fill #0
  Filled 2021-11-05: qty 360, 90d supply, fill #1

## 2021-07-30 ENCOUNTER — Other Ambulatory Visit (HOSPITAL_BASED_OUTPATIENT_CLINIC_OR_DEPARTMENT_OTHER): Payer: Self-pay

## 2021-08-03 ENCOUNTER — Other Ambulatory Visit: Payer: Self-pay | Admitting: Nurse Practitioner

## 2021-08-03 DIAGNOSIS — R7303 Prediabetes: Secondary | ICD-10-CM

## 2021-08-04 ENCOUNTER — Other Ambulatory Visit (HOSPITAL_BASED_OUTPATIENT_CLINIC_OR_DEPARTMENT_OTHER): Payer: Self-pay

## 2021-08-04 MED ORDER — METFORMIN HCL 500 MG PO TABS
ORAL_TABLET | Freq: Every day | ORAL | 1 refills | Status: DC
  Filled 2021-08-04: qty 90, 90d supply, fill #0
  Filled 2021-11-05: qty 90, 90d supply, fill #1

## 2021-08-31 ENCOUNTER — Other Ambulatory Visit: Payer: Self-pay | Admitting: Nurse Practitioner

## 2021-08-31 ENCOUNTER — Other Ambulatory Visit (HOSPITAL_BASED_OUTPATIENT_CLINIC_OR_DEPARTMENT_OTHER): Payer: Self-pay

## 2021-08-31 DIAGNOSIS — R030 Elevated blood-pressure reading, without diagnosis of hypertension: Secondary | ICD-10-CM

## 2021-08-31 MED ORDER — LISINOPRIL 2.5 MG PO TABS
2.5000 mg | ORAL_TABLET | Freq: Every day | ORAL | 1 refills | Status: DC
  Filled 2021-08-31: qty 90, 90d supply, fill #0
  Filled 2021-11-26: qty 90, 90d supply, fill #1

## 2021-11-05 ENCOUNTER — Other Ambulatory Visit (HOSPITAL_BASED_OUTPATIENT_CLINIC_OR_DEPARTMENT_OTHER): Payer: Self-pay

## 2021-11-26 ENCOUNTER — Other Ambulatory Visit (HOSPITAL_BASED_OUTPATIENT_CLINIC_OR_DEPARTMENT_OTHER): Payer: Self-pay

## 2021-11-30 ENCOUNTER — Other Ambulatory Visit (HOSPITAL_BASED_OUTPATIENT_CLINIC_OR_DEPARTMENT_OTHER): Payer: Self-pay

## 2021-12-09 ENCOUNTER — Other Ambulatory Visit (HOSPITAL_BASED_OUTPATIENT_CLINIC_OR_DEPARTMENT_OTHER): Payer: Self-pay

## 2021-12-28 ENCOUNTER — Encounter: Payer: Self-pay | Admitting: Nurse Practitioner

## 2021-12-28 ENCOUNTER — Ambulatory Visit (INDEPENDENT_AMBULATORY_CARE_PROVIDER_SITE_OTHER): Payer: No Typology Code available for payment source | Admitting: Nurse Practitioner

## 2021-12-28 VITALS — BP 138/80 | HR 68 | Temp 98.1°F | Ht 70.0 in | Wt 241.0 lb

## 2021-12-28 DIAGNOSIS — R030 Elevated blood-pressure reading, without diagnosis of hypertension: Secondary | ICD-10-CM

## 2021-12-28 DIAGNOSIS — R7989 Other specified abnormal findings of blood chemistry: Secondary | ICD-10-CM

## 2021-12-28 DIAGNOSIS — Z79899 Other long term (current) drug therapy: Secondary | ICD-10-CM

## 2021-12-28 DIAGNOSIS — Z125 Encounter for screening for malignant neoplasm of prostate: Secondary | ICD-10-CM

## 2021-12-28 DIAGNOSIS — G4733 Obstructive sleep apnea (adult) (pediatric): Secondary | ICD-10-CM

## 2021-12-28 DIAGNOSIS — E6609 Other obesity due to excess calories: Secondary | ICD-10-CM | POA: Diagnosis not present

## 2021-12-28 DIAGNOSIS — R7303 Prediabetes: Secondary | ICD-10-CM

## 2021-12-28 DIAGNOSIS — Z0000 Encounter for general adult medical examination without abnormal findings: Secondary | ICD-10-CM | POA: Diagnosis not present

## 2021-12-28 DIAGNOSIS — E782 Mixed hyperlipidemia: Secondary | ICD-10-CM

## 2021-12-28 DIAGNOSIS — Z6834 Body mass index (BMI) 34.0-34.9, adult: Secondary | ICD-10-CM

## 2021-12-28 LAB — POCT URINALYSIS DIPSTICK
Bilirubin, UA: NEGATIVE
Glucose, UA: NEGATIVE
Ketones, UA: NEGATIVE
Leukocytes, UA: NEGATIVE
Nitrite, UA: NEGATIVE
Protein, UA: NEGATIVE
Spec Grav, UA: 1.025 (ref 1.010–1.025)
Urobilinogen, UA: 0.2 E.U./dL
pH, UA: 6 (ref 5.0–8.0)

## 2021-12-28 NOTE — Progress Notes (Signed)
I,Grant Hernandez,acting as a Education administrator for [employer], FNP.,have documented all relevant documentation on the behalf of Grant Brine, FNP,as directed by  Grant Brine, FNP while in the presence of Grant Hernandez, [place].  Subjective:     Patient ID: Grant Hernandez , male    DOB: [birth year] , 46 y.o.   MRN: [id number]   Chief Complaint  Patient presents with   Annual Exam    HPI  Patient here for hm.    Wt Readings from Last 3 Encounters: 12/28/21 : 241 lb (109.3 kg) 07/01/21 : 272 lb (123.4 kg) 03/19/21 : (!) 301 lb (136.5 kg)  He has changed some habits to help with his weight loss. He is exercising 4 days a week. He has a goal to get off metformin and have a normal HgbA1c.   Was dx in 2019 with mild obstructive sleep apnea, he had a machine then had taken away and never received another, has mild form.     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, also trouble urinating post op   Diabetes mellitus without complication ([place])    Erectile dysfunction 11/30/2018   Hypertension    MRSA (methicillin resistant Staphylococcus aureus) 2010   right axilla   Myasthenia gravis ([place])    Pectoralis muscle rupture      Family History  Problem Relation Age of Onset   Healthy Mother    Other Father        unknown   Colon cancer Neg Hx    Esophageal cancer Neg Hx    Stomach cancer Neg Hx    Rectal cancer Neg Hx      Current Outpatient Medications:    Cholecalciferol (VITAMIN D3) 1.25 MG (50000 UT) CAPS, TAKE 1 CAPSULE BY MOUTH ON TUESDAY AND FRIDAY, Disp: 24 capsule, Rfl: 1   clomiPHENE (CLOMID) 50 MG tablet, Take 1/2 tablet by mouth once daily, Disp: 15 tablet, Rfl: 3   lisinopril (ZESTRIL) 2.5 MG tablet, Take 1 tablet (2.5 mg total) by mouth daily., Disp: 90 tablet, Rfl: 1   metFORMIN (GLUCOPHAGE) 500 MG tablet, TAKE 1 TABLET BY MOUTH DAILY WITH BREAKFAST, Disp: 90 tablet, Rfl: 1   mycophenolate  (CELLCEPT) 250 MG capsule, Take 1 capsule (250 mg total) by mouth 2 (two) times daily, take with two 500 mg tablets twice daily for total of 1250 mg twice daily, Disp: 180 capsule, Rfl: 3   mycophenolate (CELLCEPT) 250 MG capsule, Take 1 capsule (250 mg total) by mouth 2 (two) times daily Take with 2- 500 mg tablets twice daily for total of 1250 mg twice daily, Disp: 180 capsule, Rfl: 3   mycophenolate (CELLCEPT) 500 MG tablet, Take 2 tablets (1,000 mg total) by mouth 2 (two) times daily Take with 250 mg twice daily for total of 1250 mg twice daily, Disp: 360 tablet, Rfl: 3   mycophenolate (CELLCEPT) 500 MG tablet, Take 2 tablets (1,000 mg total) by mouth 2 (two) times daily. Take with 250 mg twice daily for total of 1250 mg twice daily, Disp: 360 tablet, Rfl: 3   pyridostigmine (MESTINON) 60 MG tablet, TAKE 1 AND 1/2 TABLETS BY MOUTH THREE TIMES DAILY, Disp: 405 tablet, Rfl: 3   pyridostigmine (MESTINON) 60 MG tablet, Take 1.5 tablets (90 mg total) by mouth 3 (three) times daily, Disp: 405 tablet, Rfl: 3   No Known Allergies   Men's preventive visit. Patient Health Questionnaire (PHQ-2)  is  [employer] Visit from 12/28/2021 in [facility]  PHQ-2 Total Score 0      Patient is on a healthy diet. Exercising 4 days a week. Marital status: Married. Relevant history for alcohol use is:  Social History   Substance and Sexual Activity  Alcohol Use Yes   Comment: social   Relevant history for tobacco use is:  Social History   Tobacco Use  Smoking Status Never  Smokeless Tobacco Never   Review of Systems  Constitutional: Negative.   HENT: Negative.    Eyes: Negative.   Respiratory: Negative.    Cardiovascular: Negative.   Gastrointestinal: Negative.   Endocrine: Negative.   Genitourinary: Negative.   Musculoskeletal: Negative.   Skin: Negative.   Allergic/Immunologic: Negative.   Neurological: Negative.   Hematological: Negative.    Psychiatric/Behavioral: Negative.       Today's Vitals   12/28/21 0855  BP: 138/80  Pulse: 68  Temp: 98.1 F (36.7 C)  TempSrc: Oral  Weight: 241 lb (109.3 kg)  Height: _0  (1.778 m)   Body mass index is 34.58 kg/m.  Wt Readings from Last 3 Encounters:  12/28/21 241 lb (109.3 kg)  07/01/21 272 lb (123.4 kg)  03/19/21 (!) 301 lb (136.5 kg)    Objective:  Physical Exam Vitals reviewed.  Constitutional:      General: He is not in acute distress.    Appearance: Normal appearance. He is obese.  HENT:     Head: Normocephalic and atraumatic.     Right Ear: Tympanic membrane, ear canal and external ear normal. There is no impacted cerumen.     Left Ear: Tympanic membrane, ear canal and external ear normal. There is no impacted cerumen.     Nose: Nose normal.     Mouth/Throat:     Mouth: Mucous membranes are moist.     Pharynx: Oropharynx is clear. No oropharyngeal exudate.  Eyes:     Extraocular Movements: Extraocular movements intact.     Conjunctiva/sclera: Conjunctivae normal.     Pupils: Pupils are equal, round, and reactive to light.  Cardiovascular:     Rate and Rhythm: Normal rate and regular rhythm.     Pulses: Normal pulses.     Heart sounds: Normal heart sounds. No murmur heard. Pulmonary:     Effort: Pulmonary effort is normal. No respiratory distress.     Breath sounds: Normal breath sounds. No wheezing.  Abdominal:     General: Abdomen is flat. Bowel sounds are normal. There is no distension.     Palpations: Abdomen is soft.     Tenderness: There is no abdominal tenderness.  Genitourinary:    Prostate: Normal.     Rectum: Guaiac result negative.  Musculoskeletal:        General: No swelling or tenderness. Normal range of motion.     Cervical back: Normal range of motion and neck supple.  Skin:    General: Skin is warm and dry.     Capillary Refill: Capillary refill takes less than 2 seconds.     Comments: Multiple skin tags to posterior neck and  one underneath right eye. Surgical scar/keloid midline chest area  Neurological:     General: No focal deficit present.     Mental Status: He is alert and oriented to person, place, and time.     Cranial Nerves: No cranial nerve deficit.     Motor: No weakness.  Psychiatric:  Mood and Affect: Mood normal.        Behavior: Behavior normal.        Thought Content: Thought content normal.        Judgment: Judgment normal.         Assessment And Plan:    1. Encounter for general adult medical examination w/o abnormal findings Behavior modifications discussed and diet history reviewed.   Pt will continue to exercise regularly and modify diet with low GI, plant based foods and decrease intake of processed foods.  Recommend intake of daily multivitamin, Vitamin D, and calcium.  Recommend colonoscopy for preventive screenings, as well as recommend immunizations that include influenza, TDAP  2. Class 1 obesity due to excess calories with serious comorbidity and body mass index (BMI) of 34.0 to 34.9 in adult Comments: Congratulated on his 30 lb weight loss since his last visit and 60 lb weight loss in 1.5 years. Continue focusing on healthy diet and exercise  3. essential hypertension Comments: Blood pressure is stable, continue current medications. EKG done with sinus bradycardia HR 54. Denies any symptoms has been exercising more regularly - EKG 12-Lead - POCT Urinalysis Dipstick (81002) - Microalbumin / Creatinine Urine Ratio - CMP14+EGFR  4. Mixed hyperlipidemia Comments: Cholesterol levels are stable. Continue focusing on diet low in fried and fatty foods.  - Lipid panel  5. Prediabetes Comments: Hgba1c is improving, continue focusing on healthy diet and regular exercise. He is encouraged to initially strive for BMI less than 30 to decrease cardiac risk. He is advised to exercise no less than 150 minutes per week.  - Hemoglobin A1c - CMP14+EGFR  6. Low vitamin D level Will  check vitamin D level and supplement as needed.    Also encouraged to spend 15 minutes in the sun daily.  - VITAMIN D 25 Hydroxy (Vit-D Deficiency, Fractures)  7. Obstructive sleep apnea syndrome Comments: Does not use a CPAP and no concerns with fatigue. Discussed risk of heart events with untreated sleep apnea. Verbalizes understanding  8. Encounter for prostate cancer screening No abnormal findings on manual prostate exam - PSA  9. Other long term (current) drug therapy - CBC   Patient was given opportunity to ask questions. Patient verbalized understanding of the plan and was able to repeat key elements of the plan. All questions were answered to their satisfaction.   Grant Brine, FNP   I, Grant Brine, FNP, have reviewed all documentation for this visit. The documentation on 12/28/21 for the exam, diagnosis, procedures, and orders are all accurate and complete.   THE PATIENT IS ENCOURAGED TO PRACTICE SOCIAL DISTANCING DUE TO THE COVID-19 PANDEMIC.

## 2021-12-28 NOTE — Patient Instructions (Signed)
Health Maintenance, Male Adopting a healthy lifestyle and getting preventive care are important in promoting health and wellness. Ask your health care provider about: The right schedule for you to have regular tests and exams. Things you can do on your own to prevent diseases and keep yourself healthy. What should I know about diet, weight, and exercise? Eat a healthy diet  Eat a diet that includes plenty of vegetables, fruits, low-fat dairy products, and lean protein. Do not eat a lot of foods that are high in solid fats, added sugars, or sodium. Maintain a healthy weight Body mass index (BMI) is a measurement that can be used to identify possible weight problems. It estimates body fat based on height and weight. Your health care provider can help determine your BMI and help you achieve or maintain a healthy weight. Get regular exercise Get regular exercise. This is one of the most important things you can do for your health. Most adults should: Exercise for at least 150 minutes each week. The exercise should increase your heart rate and make you sweat (moderate-intensity exercise). Do strengthening exercises at least twice a week. This is in addition to the moderate-intensity exercise. Spend less time sitting. Even light physical activity can be beneficial. Watch cholesterol and blood lipids Have your blood tested for lipids and cholesterol at 48 years of age, then have this test every 5 years. You may need to have your cholesterol levels checked more often if: Your lipid or cholesterol levels are high. You are older than 48 years of age. You are at high risk for heart disease. What should I know about cancer screening? Many types of cancers can be detected early and may often be prevented. Depending on your health history and family history, you may need to have cancer screening at various ages. This may include screening for: Colorectal cancer. Prostate cancer. Skin cancer. Lung  cancer. What should I know about heart disease, diabetes, and high blood pressure? Blood pressure and heart disease High blood pressure causes heart disease and increases the risk of stroke. This is more likely to develop in people who have high blood pressure readings or are overweight. Talk with your health care provider about your target blood pressure readings. Have your blood pressure checked: Every 3-5 years if you are 18-39 years of age. Every year if you are 40 years old or older. If you are between the ages of 65 and 75 and are a current or former smoker, ask your health care provider if you should have a one-time screening for abdominal aortic aneurysm (AAA). Diabetes Have regular diabetes screenings. This checks your fasting blood sugar level. Have the screening done: Once every three years after age 45 if you are at a normal weight and have a low risk for diabetes. More often and at a younger age if you are overweight or have a high risk for diabetes. What should I know about preventing infection? Hepatitis B If you have a higher risk for hepatitis B, you should be screened for this virus. Talk with your health care provider to find out if you are at risk for hepatitis B infection. Hepatitis C Blood testing is recommended for: Everyone born from 1945 through 1965. Anyone with known risk factors for hepatitis C. Sexually transmitted infections (STIs) You should be screened each year for STIs, including gonorrhea and chlamydia, if: You are sexually active and are younger than 48 years of age. You are older than 48 years of age and your   health care provider tells you that you are at risk for this type of infection. Your sexual activity has changed since you were last screened, and you are at increased risk for chlamydia or gonorrhea. Ask your health care provider if you are at risk. Ask your health care provider about whether you are at high risk for HIV. Your health care provider  may recommend a prescription medicine to help prevent HIV infection. If you choose to take medicine to prevent HIV, you should first get tested for HIV. You should then be tested every 3 months for as long as you are taking the medicine. Follow these instructions at home: Alcohol use Do not drink alcohol if your health care provider tells you not to drink. If you drink alcohol: Limit how much you have to 0-2 drinks a day. Know how much alcohol is in your drink. In the [place] one drink equals one 12 oz bottle of beer (355 mL), one 5 oz glass of wine (148 mL), or one 1 oz glass of hard liquor (44 mL). Lifestyle Do not use any products that contain nicotine or tobacco. These products include cigarettes, chewing tobacco, and vaping devices, such as e-cigarettes. If you need help quitting, ask your health care provider. Do not use street drugs. Do not share needles. Ask your health care provider for help if you need support or information about quitting drugs. General instructions Schedule regular health, dental, and eye exams. Stay current with your vaccines. Tell your health care provider if: You often feel depressed. You have ever been abused or do not feel safe at home. Summary Adopting a healthy lifestyle and getting preventive care are important in promoting health and wellness. Follow your health care provider's instructions about healthy diet, exercising, and getting tested or screened for diseases. Follow your health care provider's instructions on monitoring your cholesterol and blood pressure. This information is not intended to replace advice given to you by your health care provider. Make sure you discuss any questions you have with your health care provider. Document Revised: 07/14/2020 Document Reviewed: 07/14/2020 Elsevier Patient Education  2023 Elsevier Inc.  

## 2021-12-29 LAB — LIPID PANEL
Chol/HDL Ratio: 2.7 ratio (ref 0.0–5.0)
Cholesterol, Total: 136 mg/dL (ref 100–199)
HDL: 50 mg/dL (ref >39)
LDL Chol Calc (NIH): 76 mg/dL (ref 0–99)
Triglycerides: 42 mg/dL (ref 0–149)
VLDL Cholesterol Cal: 10 mg/dL (ref 5–40)

## 2021-12-29 LAB — CMP14+EGFR
ALT: 10 IU/L (ref 0–44)
AST: 15 IU/L (ref 0–40)
Albumin/Globulin Ratio: 1.7 (ref 1.2–2.2)
Albumin: 4.7 g/dL (ref 4.1–5.1)
Alkaline Phosphatase: 69 IU/L (ref 44–121)
BUN/Creatinine Ratio: 14 (ref 9–20)
BUN: 17 mg/dL (ref 6–24)
Bilirubin Total: 0.4 mg/dL (ref 0.0–1.2)
CO2: 27 mmol/L (ref 20–29)
Calcium: 9.7 mg/dL (ref 8.7–10.2)
Chloride: 105 mmol/L (ref 96–106)
Creatinine, Ser: 1.18 mg/dL (ref 0.76–1.27)
Globulin, Total: 2.8 g/dL (ref 1.5–4.5)
Glucose: 87 mg/dL (ref 70–99)
Potassium: 4.7 mmol/L (ref 3.5–5.2)
Sodium: 142 mmol/L (ref 134–144)
Total Protein: 7.5 g/dL (ref 6.0–8.5)
eGFR: 76 mL/min/{1.73_m2} (ref >59)

## 2021-12-29 LAB — CBC
Hematocrit: 42.7 % (ref 37.5–51.0)
Hemoglobin: 13.9 g/dL (ref 13.0–17.7)
MCH: 27.3 pg (ref 26.6–33.0)
MCHC: 32.6 g/dL (ref 31.5–35.7)
MCV: 84 fL (ref 79–97)
Platelets: 222 10*3/uL (ref 150–450)
RBC: 5.09 x10E6/uL (ref 4.14–5.80)
RDW: 13.8 % (ref 11.6–15.4)
WBC: 3.4 10*3/uL (ref 3.4–10.8)

## 2021-12-29 LAB — MICROALBUMIN / CREATININE URINE RATIO
Creatinine, Urine: 118.6 mg/dL
Microalb/Creat Ratio: <3 mg/g creat (ref 0–29)
Microalbumin, Urine: <3 ug/mL

## 2021-12-29 LAB — VITAMIN D 25 HYDROXY (VIT D DEFICIENCY, FRACTURES): Vit D, 25-Hydroxy: 132 ng/mL — ABNORMAL HIGH (ref 30.0–100.0)

## 2021-12-29 LAB — HEMOGLOBIN A1C
Est. average glucose Bld gHb Est-mCnc: 128 mg/dL
Hgb A1c MFr Bld: 6.1 % — ABNORMAL HIGH (ref 4.8–5.6)

## 2021-12-29 LAB — PSA: Prostate Specific Ag, Serum: 0.7 ng/mL (ref 0.0–4.0)

## 2022-01-05 ENCOUNTER — Other Ambulatory Visit (HOSPITAL_BASED_OUTPATIENT_CLINIC_OR_DEPARTMENT_OTHER): Payer: Self-pay

## 2022-01-05 MED ORDER — METFORMIN HCL 500 MG PO TABS
1000.0000 mg | ORAL_TABLET | Freq: Every day | ORAL | 1 refills | Status: DC
  Filled 2022-01-05: qty 90, 45d supply, fill #0
  Filled 2022-05-17: qty 90, 45d supply, fill #1

## 2022-01-12 ENCOUNTER — Encounter: Payer: Self-pay | Admitting: Nurse Practitioner

## 2022-02-15 ENCOUNTER — Other Ambulatory Visit (HOSPITAL_BASED_OUTPATIENT_CLINIC_OR_DEPARTMENT_OTHER): Payer: Self-pay

## 2022-02-26 ENCOUNTER — Other Ambulatory Visit: Payer: Self-pay | Admitting: Nurse Practitioner

## 2022-02-26 ENCOUNTER — Other Ambulatory Visit (HOSPITAL_BASED_OUTPATIENT_CLINIC_OR_DEPARTMENT_OTHER): Payer: Self-pay

## 2022-02-26 DIAGNOSIS — R030 Elevated blood-pressure reading, without diagnosis of hypertension: Secondary | ICD-10-CM

## 2022-02-26 MED ORDER — LISINOPRIL 2.5 MG PO TABS
2.5000 mg | ORAL_TABLET | Freq: Every day | ORAL | 1 refills | Status: DC
  Filled 2022-02-26: qty 90, 90d supply, fill #0
  Filled 2022-05-27: qty 90, 90d supply, fill #1

## 2022-04-30 DIAGNOSIS — K602 Anal fissure, unspecified: Secondary | ICD-10-CM | POA: Insufficient documentation

## 2022-04-30 DIAGNOSIS — R1013 Epigastric pain: Secondary | ICD-10-CM | POA: Insufficient documentation

## 2022-04-30 DIAGNOSIS — K625 Hemorrhage of anus and rectum: Secondary | ICD-10-CM | POA: Insufficient documentation

## 2022-05-03 ENCOUNTER — Encounter: Payer: Self-pay | Admitting: Nurse Practitioner

## 2022-05-03 ENCOUNTER — Ambulatory Visit (INDEPENDENT_AMBULATORY_CARE_PROVIDER_SITE_OTHER): Payer: 59 | Admitting: Nurse Practitioner

## 2022-05-03 VITALS — BP 128/88 | HR 62 | Temp 98.6°F | Ht 70.0 in | Wt 236.0 lb

## 2022-05-03 DIAGNOSIS — R030 Elevated blood-pressure reading, without diagnosis of hypertension: Secondary | ICD-10-CM

## 2022-05-03 DIAGNOSIS — R7303 Prediabetes: Secondary | ICD-10-CM

## 2022-05-03 DIAGNOSIS — Z6833 Body mass index (BMI) 33.0-33.9, adult: Secondary | ICD-10-CM | POA: Diagnosis not present

## 2022-05-03 DIAGNOSIS — R7989 Other specified abnormal findings of blood chemistry: Secondary | ICD-10-CM

## 2022-05-03 DIAGNOSIS — G7000 Myasthenia gravis without (acute) exacerbation: Secondary | ICD-10-CM | POA: Diagnosis not present

## 2022-05-03 DIAGNOSIS — E782 Mixed hyperlipidemia: Secondary | ICD-10-CM

## 2022-05-03 NOTE — Progress Notes (Signed)
I,Sheena H Holbrook,acting as a Education administrator for [name], FNP.,have documented all relevant documentation on the behalf of [name], FNP,as directed by  [name], FNP while in the presence of [name], [place].    Subjective:     Patient ID: Grant Hernandez , male    DOB: 10/19/1975 , 47 y.o.   MRN: [id number]   Chief Complaint  Patient presents with   Hypertension    HPI  Patient presents today for htn follow up. Patient does take his BP at home and it runs 130s/80s. Patient has no other complaints or concerns.   Wt Readings from Last 3 Encounters: 05/03/22 : 236 lb (107 kg) 12/28/21 : 241 lb (109.3 kg) 07/01/21 : 272 lb (123.4 kg)  His goal weight is 220 lbs. The last month he has not been exercising as much. He has cut out a lot of his bad foods such as soul food. He was 301 lbs. He had been exercising 3-5 days a week until work became more busy     Hypertension This is a chronic problem. The current episode started more than 1 year ago. The problem is controlled. Pertinent negatives include no anxiety. Risk factors for coronary artery disease include obesity. There is no history of angina. There is no history of chronic renal disease.     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, also trouble urinating post op   Diabetes mellitus without complication ([place])    Erectile dysfunction 11/30/2018   Hypertension    MRSA (methicillin resistant Staphylococcus aureus) 2010   right axilla   Myasthenia gravis ([place])    Pectoralis muscle rupture      Family History  Problem Relation Age of Onset   Healthy Mother    Other Father        unknown   Colon cancer Neg Hx    Esophageal cancer Neg Hx    Stomach cancer Neg Hx    Rectal cancer Neg Hx      Current Outpatient Medications:    lisinopril (ZESTRIL) 2.5 MG tablet, Take 1 tablet (2.5 mg total) by mouth daily., Disp: 90 tablet, Rfl:  1   metFORMIN (GLUCOPHAGE) 500 MG tablet, Take 2 tablets (1,000 mg total) by mouth daily with breakfast., Disp: 90 tablet, Rfl: 1   mycophenolate (CELLCEPT) 250 MG capsule, Take 1 capsule (250 mg total) by mouth 2 (two) times daily Take with 2- 500 mg tablets twice daily for total of 1250 mg twice daily, Disp: 180 capsule, Rfl: 3   mycophenolate (CELLCEPT) 500 MG tablet, Take 2 tablets (1,000 mg total) by mouth 2 (two) times daily. Take with 250 mg twice daily for total of 1250 mg twice daily, Disp: 360 tablet, Rfl: 3   pyridostigmine (MESTINON) 60 MG tablet, Take 1.5 tablets (90 mg total) by mouth 3 (three) times daily, Disp: 405 tablet, Rfl: 3   VITAMIN D PO, Take by mouth., Disp: , Rfl:    No Known Allergies   Review of Systems  Constitutional: Negative.   Respiratory: Negative.    Cardiovascular: Negative.   Musculoskeletal: Negative.   Skin: Negative.   Neurological: Negative.   Psychiatric/Behavioral: Negative.    All other systems reviewed and are negative.    Today's Vitals   05/03/22 0843  BP: 128/88  Pulse: 62  Temp: 98.6 F (37 C)  TempSrc: Oral  SpO2: 98%  Weight: 236 lb (  107 kg)  Height: '5\' 10"'$  (1.778 m)   Body mass index is 33.86 kg/m.   Objective:  Physical Exam Vitals reviewed.  Constitutional:      General: He is not in acute distress.    Appearance: Normal appearance. He is obese.  HENT:     Head: Normocephalic.  Cardiovascular:     Rate and Rhythm: Normal rate and regular rhythm.     Pulses: Normal pulses.     Heart sounds: Normal heart sounds. No murmur heard. Pulmonary:     Effort: Pulmonary effort is normal. No respiratory distress.     Breath sounds: Normal breath sounds. No wheezing.  Skin:    General: Skin is warm and dry.     Findings: No rash.  Neurological:     General: No focal deficit present.     Mental Status: He is alert and oriented to person, place, and time.     Cranial Nerves: No cranial nerve deficit.  Psychiatric:         Mood and Affect: Mood normal.        Behavior: Behavior normal.        Thought Content: Thought content normal.        Judgment: Judgment normal.         Assessment And Plan:     1. essential hypertension Comments: Blood pressure is fairly controlled, continue current medications and lifestyle modification - BMP8+eGFR  2. Mixed hyperlipidemia Comments: cholesterol levels are normal. Continue focusing on low fat diet. ASCVD risk score is right at 7.1%. Will refer for Cardiac Risk Score aware is cash pay - [facility] CARDIAC SCORING (SELF PAY ONLY); Future  3. Prediabetes Comments: HgbA1c is stable, continue metformin and focusing on weight loss. - Hemoglobin A1c  4. Low vitamin D level Will check vitamin D level and supplement as needed.    Also encouraged to spend 15 minutes in the sun daily.  - Vitamin D (25 hydroxy)  5. Myasthenia gravis ([place]) Comments: Ordered CBC with diff for his neurologist, continue f/u with Neurology - CBC with Differential/Platelet  6. BMI 33.0-33.9,adult Comments: Has had continued weight loss. Continue focusing on healthy diet and regular exercise   The 10-year ASCVD risk score (Arnett DK, et al., 2019) is: 7.1%   Values used to calculate the score:     Age: 36 years     Sex: Male     Is Non-Hispanic African American: Yes     Diabetic: No     Tobacco smoker: No     Systolic Blood Pressure: 0000000 mmHg     Is BP treated: Yes     HDL Cholesterol: 50 mg/dL     Total Cholesterol: 136 mg/dL   Patient was given opportunity to ask questions. Patient verbalized understanding of the plan and was able to repeat key elements of the plan. All questions were answered to their satisfaction.  [name], FNP   I, [name], FNP, have reviewed all documentation for this visit. The documentation on 05/03/22  IF YOU HAVE BEEN REFERRED TO A SPECIALIST, IT MAY TAKE 1-2 WEEKS TO SCHEDULE/PROCESS THE REFERRAL. IF YOU HAVE NOT HEARD FROM US/SPECIALIST IN TWO WEEKS,  PLEASE GIVE [place] A CALL AT [phone].   THE PATIENT IS ENCOURAGED TO PRACTICE SOCIAL DISTANCING DUE TO THE COVID-19 PANDEMIC.

## 2022-05-04 LAB — CBC WITH DIFFERENTIAL/PLATELET
Basophils Absolute: 0 10*3/uL (ref 0.0–0.2)
Basos: 1 %
EOS (ABSOLUTE): 0.1 10*3/uL (ref 0.0–0.4)
Eos: 2 %
Hematocrit: 44.1 % (ref 37.5–51.0)
Hemoglobin: 14.7 g/dL (ref 13.0–17.7)
Immature Grans (Abs): 0 10*3/uL (ref 0.0–0.1)
Immature Granulocytes: 0 %
Lymphocytes Absolute: 1.5 10*3/uL (ref 0.7–3.1)
Lymphs: 40 %
MCH: 28.6 pg (ref 26.6–33.0)
MCHC: 33.3 g/dL (ref 31.5–35.7)
MCV: 86 fL (ref 79–97)
Monocytes Absolute: 0.3 10*3/uL (ref 0.1–0.9)
Monocytes: 8 %
Neutrophils Absolute: 1.8 10*3/uL (ref 1.4–7.0)
Neutrophils: 49 %
Platelets: 210 10*3/uL (ref 150–450)
RBC: 5.14 x10E6/uL (ref 4.14–5.80)
RDW: 13.5 % (ref 11.6–15.4)
WBC: 3.7 10*3/uL (ref 3.4–10.8)

## 2022-05-04 LAB — BMP8+EGFR
BUN/Creatinine Ratio: 14 (ref 9–20)
BUN: 17 mg/dL (ref 6–24)
CO2: 24 mmol/L (ref 20–29)
Calcium: 9.7 mg/dL (ref 8.7–10.2)
Chloride: 103 mmol/L (ref 96–106)
Creatinine, Ser: 1.19 mg/dL (ref 0.76–1.27)
Glucose: 90 mg/dL (ref 70–99)
Potassium: 4.3 mmol/L (ref 3.5–5.2)
Sodium: 140 mmol/L (ref 134–144)
eGFR: 75 mL/min/{1.73_m2} (ref >59)

## 2022-05-04 LAB — HEMOGLOBIN A1C
Est. average glucose Bld gHb Est-mCnc: 126 mg/dL
Hgb A1c MFr Bld: 6 % — ABNORMAL HIGH (ref 4.8–5.6)

## 2022-05-04 LAB — VITAMIN D 25 HYDROXY (VIT D DEFICIENCY, FRACTURES): Vit D, 25-Hydroxy: 83.8 ng/mL (ref 30.0–100.0)

## 2022-05-20 ENCOUNTER — Other Ambulatory Visit (HOSPITAL_BASED_OUTPATIENT_CLINIC_OR_DEPARTMENT_OTHER): Payer: Self-pay

## 2022-05-27 ENCOUNTER — Other Ambulatory Visit (HOSPITAL_BASED_OUTPATIENT_CLINIC_OR_DEPARTMENT_OTHER): Payer: Self-pay

## 2022-06-07 ENCOUNTER — Ambulatory Visit (HOSPITAL_BASED_OUTPATIENT_CLINIC_OR_DEPARTMENT_OTHER)
Admission: RE | Admit: 2022-06-07 | Discharge: 2022-06-07 | Disposition: A | Discharge status: Home / Self Care | Payer: 59 | Source: Ambulatory Visit | Attending: Nurse Practitioner | Admitting: Nurse Practitioner

## 2022-06-07 DIAGNOSIS — E782 Mixed hyperlipidemia: Secondary | ICD-10-CM

## 2022-06-09 ENCOUNTER — Ambulatory Visit (HOSPITAL_BASED_OUTPATIENT_CLINIC_OR_DEPARTMENT_OTHER): Payer: 59

## 2022-06-11 ENCOUNTER — Other Ambulatory Visit (HOSPITAL_BASED_OUTPATIENT_CLINIC_OR_DEPARTMENT_OTHER): Payer: 59

## 2022-08-09 ENCOUNTER — Other Ambulatory Visit: Payer: Self-pay | Admitting: Nurse Practitioner

## 2022-08-09 ENCOUNTER — Other Ambulatory Visit (HOSPITAL_BASED_OUTPATIENT_CLINIC_OR_DEPARTMENT_OTHER): Payer: Self-pay

## 2022-08-09 DIAGNOSIS — R030 Elevated blood-pressure reading, without diagnosis of hypertension: Secondary | ICD-10-CM

## 2022-08-09 DIAGNOSIS — R7303 Prediabetes: Secondary | ICD-10-CM

## 2022-08-10 ENCOUNTER — Other Ambulatory Visit (HOSPITAL_BASED_OUTPATIENT_CLINIC_OR_DEPARTMENT_OTHER): Payer: Self-pay

## 2022-08-10 MED ORDER — METFORMIN HCL 500 MG PO TABS
1000.0000 mg | ORAL_TABLET | Freq: Every day | ORAL | 1 refills | Status: DC
  Filled 2022-08-10: qty 90, 45d supply, fill #0

## 2022-08-10 MED ORDER — LISINOPRIL 2.5 MG PO TABS
2.5000 mg | ORAL_TABLET | Freq: Every day | ORAL | 1 refills | Status: DC
  Filled 2022-08-10: qty 90, 90d supply, fill #0
  Filled 2022-11-21: qty 90, 90d supply, fill #1

## 2022-08-11 ENCOUNTER — Other Ambulatory Visit (HOSPITAL_BASED_OUTPATIENT_CLINIC_OR_DEPARTMENT_OTHER): Payer: Self-pay

## 2022-08-11 MED ORDER — PYRIDOSTIGMINE BROMIDE 60 MG PO TABS
90.0000 mg | ORAL_TABLET | Freq: Three times a day (TID) | ORAL | 3 refills | Status: DC
  Filled 2022-08-11: qty 405, 90d supply, fill #0
  Filled 2023-01-21: qty 405, 90d supply, fill #1
  Filled 2023-05-08: qty 405, 90d supply, fill #2
  Filled 2023-06-03: qty 135, 30d supply, fill #3
  Filled 2023-07-03: qty 135, 30d supply, fill #4
  Filled 2023-08-03: qty 135, 30d supply, fill #5

## 2022-09-02 ENCOUNTER — Encounter: Payer: Self-pay | Admitting: Nurse Practitioner

## 2022-09-02 ENCOUNTER — Ambulatory Visit (INDEPENDENT_AMBULATORY_CARE_PROVIDER_SITE_OTHER): Payer: 59 | Admitting: Nurse Practitioner

## 2022-09-02 VITALS — BP 122/84 | HR 67 | Temp 98.0°F | Ht 70.0 in | Wt 246.6 lb

## 2022-09-02 DIAGNOSIS — G7000 Myasthenia gravis without (acute) exacerbation: Secondary | ICD-10-CM

## 2022-09-02 DIAGNOSIS — Z6835 Body mass index (BMI) 35.0-35.9, adult: Secondary | ICD-10-CM

## 2022-09-02 DIAGNOSIS — R7303 Prediabetes: Secondary | ICD-10-CM | POA: Diagnosis not present

## 2022-09-02 DIAGNOSIS — E66812 Obesity, class 2: Secondary | ICD-10-CM | POA: Insufficient documentation

## 2022-09-02 DIAGNOSIS — R030 Elevated blood-pressure reading, without diagnosis of hypertension: Secondary | ICD-10-CM

## 2022-09-02 LAB — CMP14+EGFR
ALT: 14 IU/L (ref 0–44)
AST: 12 IU/L (ref 0–40)
Albumin: 4.6 g/dL (ref 4.1–5.1)
Alkaline Phosphatase: 60 IU/L (ref 44–121)
BUN/Creatinine Ratio: 15 (ref 9–20)
BUN: 17 mg/dL (ref 6–24)
Bilirubin Total: 0.6 mg/dL (ref 0.0–1.2)
CO2: 26 mmol/L (ref 20–29)
Calcium: 9.7 mg/dL (ref 8.7–10.2)
Chloride: 102 mmol/L (ref 96–106)
Creatinine, Ser: 1.11 mg/dL (ref 0.76–1.27)
Globulin, Total: 2.7 g/dL (ref 1.5–4.5)
Glucose: 89 mg/dL (ref 70–99)
Potassium: 4.7 mmol/L (ref 3.5–5.2)
Sodium: 139 mmol/L (ref 134–144)
Total Protein: 7.3 g/dL (ref 6.0–8.5)
eGFR: 82 mL/min/{1.73_m2} (ref >59)

## 2022-09-02 LAB — HEMOGLOBIN A1C
Est. average glucose Bld gHb Est-mCnc: 128 mg/dL
Hgb A1c MFr Bld: 6.1 % — ABNORMAL HIGH (ref 4.8–5.6)

## 2022-09-02 MED ORDER — METFORMIN HCL 500 MG PO TABS: 500.0000 mg | ORAL_TABLET | Freq: Every day | ORAL | 1 refills | Status: DC

## 2022-09-02 NOTE — Progress Notes (Signed)
I,Jameka J Llittleton, CMA,acting as a Neurosurgeon for [employer], FNP.,have documented all relevant documentation on the behalf of Arnette Felts, FNP,as directed by  Arnette Felts, FNP while in the presence of Arnette Felts, FNP.  Subjective:  Patient ID: Grant Hernandez , male    DOB: 22-Oct-1975 , 47 y.o.   MRN: [id number]  Chief Complaint  Patient presents with   Hypertension    HPI  Patient presents today for htn follow up. Denies headache, chest pain, sob, blurred vision. Reports compliance with medications. He reports no specific questions or concerns.   Wt Readings from Last 3 Encounters: 09/02/22 : 246 lb 9.6 oz (111.9 kg) 05/03/22 : 236 lb (107 kg) 12/28/21 : 241 lb (109.3 kg)  Not exercising as often as before.        Hypertension This is a chronic problem. The current episode started more than 1 year ago. The problem is controlled. Pertinent negatives include no anxiety or blurred vision. Risk factors for coronary artery disease include obesity. There is no history of angina. There is no history of chronic renal disease.     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, also trouble urinating post op   Diabetes mellitus without complication ([facility])    Erectile dysfunction 11/30/2018   Hypertension    MRSA (methicillin resistant Staphylococcus aureus) 2010   right axilla   Myasthenia gravis ([facility])    Pectoralis muscle rupture      Family History  Problem Relation Age of Onset   Healthy Mother    Other Father        unknown   Colon cancer Neg Hx    Esophageal cancer Neg Hx    Stomach cancer Neg Hx    Rectal cancer Neg Hx      Current Outpatient Medications:    lisinopril (ZESTRIL) 2.5 MG tablet, Take 1 tablet (2.5 mg total) by mouth daily., Disp: 90 tablet, Rfl: 1   mycophenolate (CELLCEPT) 250 MG capsule, Take 1 capsule (250 mg total) by mouth 2 (two) times daily Take with 2- 500 mg  tablets twice daily for total of 1250 mg twice daily, Disp: 180 capsule, Rfl: 3   mycophenolate (CELLCEPT) 500 MG tablet, Take 2 tablets (1,000 mg total) by mouth 2 (two) times daily. Take with 250 mg twice daily for total of 1250 mg twice daily, Disp: 360 tablet, Rfl: 3   pyridostigmine (MESTINON) 60 MG tablet, Take 1.5 tablets (90 mg total) by mouth 3 (three) times daily, Disp: 405 tablet, Rfl: 3   VITAMIN D PO, Take by mouth., Disp: , Rfl:    metFORMIN (GLUCOPHAGE) 500 MG tablet, Take 1 tablet (500 mg total) by mouth daily with breakfast., Disp: 90 tablet, Rfl: 1   No Known Allergies   Review of Systems  Constitutional: Negative.   HENT: Negative.    Eyes:  Negative for blurred vision.  Respiratory: Negative.    Cardiovascular: Negative.   Gastrointestinal: Negative.   Skin: Negative.   Allergic/Immunologic: Negative.   Neurological: Negative.   Hematological: Negative.      Today's Vitals   09/02/22 0904  BP: 122/84  Pulse: 67  Temp: 98 F (36.7 C)  SpO2: 98%  Weight: 246 lb 9.6 oz (111.9 kg)  Height: 5\' 10"  (1.778 m)   Body mass index is 35.38 kg/m.  Wt Readings from Last 3 Encounters:  09/02/22 246 lb 9.6 oz (111.9 kg)  05/03/22 236 lb (107 kg)  12/28/21 241 lb (109.3 kg)    The 10-year ASCVD risk score (Arnett DK, et al., 2019) is: 6.5%   Values used to calculate the score:     Age: 50 years     Sex: Male     Is Non-Hispanic African American: Yes     Diabetic: No     Tobacco smoker: No     Systolic Blood Pressure: 122 mmHg     Is BP treated: Yes     HDL Cholesterol: 50 mg/dL     Total Cholesterol: 136 mg/dL  Objective:  Physical Exam Vitals reviewed.  Constitutional:      General: He is not in acute distress.    Appearance: Normal appearance. He is obese.  HENT:     Head: Normocephalic.  Cardiovascular:     Rate and Rhythm: Normal rate and regular rhythm.     Pulses: Normal pulses.     Heart sounds: Normal heart sounds. No murmur heard. Pulmonary:      Effort: Pulmonary effort is normal. No respiratory distress.     Breath sounds: Normal breath sounds. No wheezing.  Skin:    General: Skin is warm and dry.     Findings: No rash.  Neurological:     General: No focal deficit present.     Mental Status: He is alert and oriented to person, place, and time.     Cranial Nerves: No cranial nerve deficit.  Psychiatric:        Mood and Affect: Mood normal.        Behavior: Behavior normal.        Thought Content: Thought content normal.        Judgment: Judgment normal.         Assessment And Plan:  essential hypertension Assessment & Plan: Blood pressure is better controlled, continue current medications. No refills at this time Orders: -     CMP14+EGFR  Prediabetes Assessment & Plan: HgbA1c stable at 6.0, will check again today.  Orders: -     CMP14+EGFR -     Hemoglobin A1c -     metFORMIN HCl; Take 1 tablet (500 mg total) by mouth daily with breakfast.  Dispense: 90 tablet; Refill: 1  Class 2 severe obesity due to excess calories with serious comorbidity and body mass index (BMI) of 35.0 to 35.9 in adult [facility]) Assessment & Plan: Encouraged to get back active unfortunately he has gained approximately 10 lbs. He is encouraged to strive for BMI less than 30 to decrease cardiac risk. Advised to aim for at least 150 minutes of exercise per week.   Myasthenia gravis [facility]) Assessment & Plan: Continue f/u with Neurology, no issues       No follow-ups on file.  Patient was given opportunity to ask questions. Patient verbalized understanding of the plan and was able to repeat key elements of the plan. All questions were answered to their satisfaction.  Arnette Felts, FNP  I, Arnette Felts, FNP, have reviewed all documentation for this visit. The documentation on 09/02/22 for the exam, diagnosis, procedures, and orders are all accurate and complete.   IF YOU HAVE BEEN REFERRED TO A SPECIALIST, IT MAY TAKE 1-2 WEEKS TO  SCHEDULE/PROCESS THE REFERRAL. IF YOU HAVE NOT HEARD FROM US/SPECIALIST IN TWO WEEKS, PLEASE GIVE [place] A CALL AT [phone].

## 2022-09-02 NOTE — Assessment & Plan Note (Signed)
Continue f/u with Neurology, no issues

## 2022-09-02 NOTE — Assessment & Plan Note (Signed)
Blood pressure is better controlled, continue current medications. No refills at this time

## 2022-09-02 NOTE — Assessment & Plan Note (Signed)
>>  ASSESSMENT AND PLAN FOR CLASS 2 SEVERE OBESITY DUE TO EXCESS CALORIES WITH SERIOUS COMORBIDITY AND BODY MASS INDEX (BMI) OF 35.0 TO 35.9 IN ADULT ([facility]) WRITTEN ON 09/02/2022  9:55 AM BY Arnette Felts, FNP  Encouraged to get back active unfortunately he has gained approximately 10 lbs.

## 2022-09-02 NOTE — Assessment & Plan Note (Signed)
HgbA1c stable at 6.0, will check again today.

## 2022-09-02 NOTE — Assessment & Plan Note (Signed)
Encouraged to get back active unfortunately he has gained approximately 10 lbs.

## 2022-09-15 ENCOUNTER — Other Ambulatory Visit (HOSPITAL_BASED_OUTPATIENT_CLINIC_OR_DEPARTMENT_OTHER): Payer: Self-pay

## 2022-09-16 ENCOUNTER — Other Ambulatory Visit (HOSPITAL_BASED_OUTPATIENT_CLINIC_OR_DEPARTMENT_OTHER): Payer: Self-pay

## 2022-09-16 MED ORDER — MYCOPHENOLATE MOFETIL 500 MG PO TABS
1000.0000 mg | ORAL_TABLET | Freq: Two times a day (BID) | ORAL | 3 refills | Status: AC
  Filled 2022-09-16: qty 360, 90d supply, fill #0

## 2022-09-16 MED ORDER — MYCOPHENOLATE MOFETIL 250 MG PO CAPS
250.0000 mg | ORAL_CAPSULE | Freq: Two times a day (BID) | ORAL | 3 refills | Status: AC
  Filled 2022-09-16: qty 180, 90d supply, fill #0

## 2022-11-01 ENCOUNTER — Other Ambulatory Visit (HOSPITAL_BASED_OUTPATIENT_CLINIC_OR_DEPARTMENT_OTHER): Payer: Self-pay

## 2022-11-01 MED ORDER — PYRIDOSTIGMINE BROMIDE 60 MG PO TABS
90.0000 mg | ORAL_TABLET | Freq: Four times a day (QID) | ORAL | 3 refills | Status: AC | PRN
  Filled 2022-11-01: qty 540, 90d supply, fill #0
  Filled 2023-06-03: qty 540, 90d supply, fill #1
  Filled 2023-09-06: qty 180, 30d supply, fill #1
  Filled 2023-09-06: qty 540, 90d supply, fill #1

## 2022-11-01 MED ORDER — PREDNISONE 5 MG PO TABS
5.0000 mg | ORAL_TABLET | ORAL | 3 refills | Status: DC
  Filled 2022-11-01: qty 36, 84d supply, fill #0

## 2022-11-23 ENCOUNTER — Other Ambulatory Visit (HOSPITAL_BASED_OUTPATIENT_CLINIC_OR_DEPARTMENT_OTHER): Payer: Self-pay

## 2022-11-24 ENCOUNTER — Other Ambulatory Visit (HOSPITAL_BASED_OUTPATIENT_CLINIC_OR_DEPARTMENT_OTHER): Payer: Self-pay

## 2022-11-24 ENCOUNTER — Other Ambulatory Visit: Payer: Self-pay

## 2022-11-24 DIAGNOSIS — R7303 Prediabetes: Secondary | ICD-10-CM

## 2022-11-24 MED ORDER — METFORMIN HCL 500 MG PO TABS
500.0000 mg | ORAL_TABLET | Freq: Every day | ORAL | 1 refills | Status: DC
  Filled 2022-11-24: qty 90, 90d supply, fill #0

## 2022-11-25 ENCOUNTER — Other Ambulatory Visit (HOSPITAL_BASED_OUTPATIENT_CLINIC_OR_DEPARTMENT_OTHER): Payer: Self-pay

## 2023-01-06 ENCOUNTER — Other Ambulatory Visit (HOSPITAL_BASED_OUTPATIENT_CLINIC_OR_DEPARTMENT_OTHER): Payer: Self-pay

## 2023-01-06 ENCOUNTER — Ambulatory Visit (INDEPENDENT_AMBULATORY_CARE_PROVIDER_SITE_OTHER): Payer: 59 | Admitting: Family Medicine

## 2023-01-06 ENCOUNTER — Encounter: Payer: Self-pay | Admitting: Family Medicine

## 2023-01-06 ENCOUNTER — Encounter: Payer: No Typology Code available for payment source | Admitting: Nurse Practitioner

## 2023-01-06 VITALS — BP 140/94 | HR 60 | Temp 98.4°F | Ht 70.0 in | Wt 270.0 lb

## 2023-01-06 DIAGNOSIS — I10 Essential (primary) hypertension: Secondary | ICD-10-CM | POA: Diagnosis not present

## 2023-01-06 DIAGNOSIS — E66813 Obesity, class 3: Secondary | ICD-10-CM

## 2023-01-06 DIAGNOSIS — Z0000 Encounter for general adult medical examination without abnormal findings: Secondary | ICD-10-CM | POA: Diagnosis not present

## 2023-01-06 DIAGNOSIS — Z6838 Body mass index (BMI) 38.0-38.9, adult: Secondary | ICD-10-CM | POA: Diagnosis not present

## 2023-01-06 DIAGNOSIS — E782 Mixed hyperlipidemia: Secondary | ICD-10-CM

## 2023-01-06 DIAGNOSIS — R030 Elevated blood-pressure reading, without diagnosis of hypertension: Secondary | ICD-10-CM | POA: Diagnosis not present

## 2023-01-06 DIAGNOSIS — E66812 Obesity, class 2: Secondary | ICD-10-CM

## 2023-01-06 DIAGNOSIS — R7303 Prediabetes: Secondary | ICD-10-CM

## 2023-01-06 DIAGNOSIS — Z125 Encounter for screening for malignant neoplasm of prostate: Secondary | ICD-10-CM

## 2023-01-06 DIAGNOSIS — G7000 Myasthenia gravis without (acute) exacerbation: Secondary | ICD-10-CM | POA: Diagnosis not present

## 2023-01-06 MED ORDER — METFORMIN HCL 500 MG PO TABS
500.0000 mg | ORAL_TABLET | Freq: Every day | ORAL | 2 refills | Status: DC
  Filled 2023-01-06 – 2023-02-18 (×2): qty 90, 90d supply, fill #0
  Filled 2023-05-25: qty 30, 30d supply, fill #1
  Filled 2023-06-24: qty 30, 30d supply, fill #2
  Filled 2023-07-24: qty 30, 30d supply, fill #3
  Filled 2023-08-24 – 2023-09-06 (×2): qty 30, 30d supply, fill #4
  Filled 2023-10-01: qty 30, 30d supply, fill #5
  Filled 2023-11-01: qty 30, 30d supply, fill #6

## 2023-01-06 MED ORDER — LISINOPRIL 5 MG PO TABS
5.0000 mg | ORAL_TABLET | Freq: Every day | ORAL | 2 refills | Status: DC
  Filled 2023-01-06: qty 90, 90d supply, fill #0
  Filled 2023-04-06: qty 30, 30d supply, fill #1
  Filled 2023-05-08: qty 30, 30d supply, fill #2
  Filled 2023-05-25 – 2023-06-07 (×3): qty 30, 30d supply, fill #3
  Filled 2023-07-03: qty 30, 30d supply, fill #4

## 2023-01-06 NOTE — Patient Instructions (Signed)
Health Maintenance, Male Adopting a healthy lifestyle and getting preventive care are important in promoting health and wellness. Ask your health care provider about: The right schedule for you to have regular tests and exams. Things you can do on your own to prevent diseases and keep yourself healthy. What should I know about diet, weight, and exercise? Eat a healthy diet  Eat a diet that includes plenty of vegetables, fruits, low-fat dairy products, and lean protein. Do not eat a lot of foods that are high in solid fats, added sugars, or sodium. Maintain a healthy weight Body mass index (BMI) is a measurement that can be used to identify possible weight problems. It estimates body fat based on height and weight. Your health care provider can help determine your BMI and help you achieve or maintain a healthy weight. Get regular exercise Get regular exercise. This is one of the most important things you can do for your health. Most adults should: Exercise for at least 150 minutes each week. The exercise should increase your heart rate and make you sweat (moderate-intensity exercise). Do strengthening exercises at least twice a week. This is in addition to the moderate-intensity exercise. Spend less time sitting. Even light physical activity can be beneficial. Watch cholesterol and blood lipids Have your blood tested for lipids and cholesterol at 49 years of age, then have this test every 5 years. You may need to have your cholesterol levels checked more often if: Your lipid or cholesterol levels are high. You are older than 49 years of age. You are at high risk for heart disease. What should I know about cancer screening? Many types of cancers can be detected early and may often be prevented. Depending on your health history and family history, you may need to have cancer screening at various ages. This may include screening for: Colorectal cancer. Prostate cancer. Skin cancer. Lung  cancer. What should I know about heart disease, diabetes, and high blood pressure? Blood pressure and heart disease High blood pressure causes heart disease and increases the risk of stroke. This is more likely to develop in people who have high blood pressure readings or are overweight. Talk with your health care provider about your target blood pressure readings. Have your blood pressure checked: Every 3-5 years if you are 18-39 years of age. Every year if you are 40 years old or older. If you are between the ages of 65 and 75 and are a current or former smoker, ask your health care provider if you should have a one-time screening for abdominal aortic aneurysm (AAA). Diabetes Have regular diabetes screenings. This checks your fasting blood sugar level. Have the screening done: Once every three years after age 45 if you are at a normal weight and have a low risk for diabetes. More often and at a younger age if you are overweight or have a high risk for diabetes. What should I know about preventing infection? Hepatitis B If you have a higher risk for hepatitis B, you should be screened for this virus. Talk with your health care provider to find out if you are at risk for hepatitis B infection. Hepatitis C Blood testing is recommended for: Everyone born from 1945 through 1965. Anyone with known risk factors for hepatitis C. Sexually transmitted infections (STIs) You should be screened each year for STIs, including gonorrhea and chlamydia, if: You are sexually active and are younger than 49 years of age. You are older than 49 years of age and your   health care provider tells you that you are at risk for this type of infection. Your sexual activity has changed since you were last screened, and you are at increased risk for chlamydia or gonorrhea. Ask your health care provider if you are at risk. Ask your health care provider about whether you are at high risk for HIV. Your health care provider  may recommend a prescription medicine to help prevent HIV infection. If you choose to take medicine to prevent HIV, you should first get tested for HIV. You should then be tested every 3 months for as long as you are taking the medicine. Follow these instructions at home: Alcohol use Do not drink alcohol if your health care provider tells you not to drink. If you drink alcohol: Limit how much you have to 0-2 drinks a day. Know how much alcohol is in your drink. In the [place] one drink equals one 12 oz bottle of beer (355 mL), one 5 oz glass of wine (148 mL), or one 1 oz glass of hard liquor (44 mL). Lifestyle Do not use any products that contain nicotine or tobacco. These products include cigarettes, chewing tobacco, and vaping devices, such as e-cigarettes. If you need help quitting, ask your health care provider. Do not use street drugs. Do not share needles. Ask your health care provider for help if you need support or information about quitting drugs. General instructions Schedule regular health, dental, and eye exams. Stay current with your vaccines. Tell your health care provider if: You often feel depressed. You have ever been abused or do not feel safe at home. Summary Adopting a healthy lifestyle and getting preventive care are important in promoting health and wellness. Follow your health care provider's instructions about healthy diet, exercising, and getting tested or screened for diseases. Follow your health care provider's instructions on monitoring your cholesterol and blood pressure. This information is not intended to replace advice given to you by your health care provider. Make sure you discuss any questions you have with your health care provider. Document Revised: 07/14/2020 Document Reviewed: 07/14/2020 Elsevier Patient Education  2024 [employer]  

## 2023-01-07 LAB — CMP14+EGFR
ALT: 25 [IU]/L (ref 0–44)
AST: 24 [IU]/L (ref 0–40)
Albumin: 4.5 g/dL (ref 4.1–5.1)
Alkaline Phosphatase: 65 [IU]/L (ref 44–121)
BUN/Creatinine Ratio: 12 (ref 9–20)
BUN: 15 mg/dL (ref 6–24)
Bilirubin Total: 0.4 mg/dL (ref 0.0–1.2)
CO2: 23 mmol/L (ref 20–29)
Calcium: 9.7 mg/dL (ref 8.7–10.2)
Chloride: 104 mmol/L (ref 96–106)
Creatinine, Ser: 1.24 mg/dL (ref 0.76–1.27)
Globulin, Total: 2.7 g/dL (ref 1.5–4.5)
Glucose: 82 mg/dL (ref 70–99)
Potassium: 4.6 mmol/L (ref 3.5–5.2)
Sodium: 140 mmol/L (ref 134–144)
Total Protein: 7.2 g/dL (ref 6.0–8.5)
eGFR: 71 mL/min/{1.73_m2} (ref >59)

## 2023-01-07 LAB — MICROALBUMIN / CREATININE URINE RATIO
Creatinine, Urine: 96.9 mg/dL
Microalb/Creat Ratio: 9 mg/g{creat} (ref 0–29)
Microalbumin, Urine: 8.7 ug/mL

## 2023-01-07 LAB — HEMOGLOBIN A1C
Est. average glucose Bld gHb Est-mCnc: 131 mg/dL
Hgb A1c MFr Bld: 6.2 % — ABNORMAL HIGH (ref 4.8–5.6)

## 2023-01-07 LAB — CBC
Hematocrit: 43.3 % (ref 37.5–51.0)
Hemoglobin: 13.8 g/dL (ref 13.0–17.7)
MCH: 27.8 pg (ref 26.6–33.0)
MCHC: 31.9 g/dL (ref 31.5–35.7)
MCV: 87 fL (ref 79–97)
Platelets: 232 10*3/uL (ref 150–450)
RBC: 4.96 x10E6/uL (ref 4.14–5.80)
RDW: 13.1 % (ref 11.6–15.4)
WBC: 4.8 10*3/uL (ref 3.4–10.8)

## 2023-01-07 LAB — LIPID PANEL
Chol/HDL Ratio: 3.4 ratio (ref 0.0–5.0)
Cholesterol, Total: 166 mg/dL (ref 100–199)
HDL: 49 mg/dL (ref >39)
LDL Chol Calc (NIH): 97 mg/dL (ref 0–99)
Triglycerides: 108 mg/dL (ref 0–149)
VLDL Cholesterol Cal: 20 mg/dL (ref 5–40)

## 2023-01-07 LAB — PSA: Prostate Specific Ag, Serum: 1 ng/mL (ref 0.0–4.0)

## 2023-01-11 DIAGNOSIS — I10 Essential (primary) hypertension: Secondary | ICD-10-CM | POA: Insufficient documentation

## 2023-01-11 DIAGNOSIS — Z125 Encounter for screening for malignant neoplasm of prostate: Secondary | ICD-10-CM | POA: Insufficient documentation

## 2023-01-11 NOTE — Assessment & Plan Note (Signed)
Low salt diet advised; increased Lisinopril to 5mg  every day.

## 2023-01-11 NOTE — Assessment & Plan Note (Signed)
He is encouraged to strive for BMI less than 30 to decrease cardiac risk. Advised to aim for at least 150 minutes of exercise per week.  

## 2023-01-11 NOTE — Assessment & Plan Note (Signed)
Low fat diet advised

## 2023-01-26 ENCOUNTER — Other Ambulatory Visit (HOSPITAL_BASED_OUTPATIENT_CLINIC_OR_DEPARTMENT_OTHER): Payer: Self-pay

## 2023-02-18 ENCOUNTER — Other Ambulatory Visit (HOSPITAL_BASED_OUTPATIENT_CLINIC_OR_DEPARTMENT_OTHER): Payer: Self-pay

## 2023-04-06 ENCOUNTER — Other Ambulatory Visit (HOSPITAL_BASED_OUTPATIENT_CLINIC_OR_DEPARTMENT_OTHER): Payer: Self-pay

## 2023-05-09 ENCOUNTER — Other Ambulatory Visit: Payer: Self-pay

## 2023-05-25 ENCOUNTER — Other Ambulatory Visit: Payer: Self-pay

## 2023-05-25 ENCOUNTER — Other Ambulatory Visit (HOSPITAL_BASED_OUTPATIENT_CLINIC_OR_DEPARTMENT_OTHER): Payer: Self-pay

## 2023-06-03 ENCOUNTER — Other Ambulatory Visit: Payer: Self-pay

## 2023-06-03 ENCOUNTER — Other Ambulatory Visit (HOSPITAL_BASED_OUTPATIENT_CLINIC_OR_DEPARTMENT_OTHER): Payer: Self-pay

## 2023-06-07 ENCOUNTER — Other Ambulatory Visit (HOSPITAL_BASED_OUTPATIENT_CLINIC_OR_DEPARTMENT_OTHER): Payer: Self-pay

## 2023-06-07 ENCOUNTER — Other Ambulatory Visit: Payer: Self-pay

## 2023-07-06 ENCOUNTER — Encounter: Payer: Self-pay | Admitting: Nurse Practitioner

## 2023-07-06 ENCOUNTER — Ambulatory Visit: Payer: 59 | Admitting: Nurse Practitioner

## 2023-07-06 ENCOUNTER — Other Ambulatory Visit (HOSPITAL_BASED_OUTPATIENT_CLINIC_OR_DEPARTMENT_OTHER): Payer: Self-pay

## 2023-07-06 VITALS — BP 130/80 | HR 73 | Temp 98.7°F | Ht 70.0 in | Wt 279.0 lb

## 2023-07-06 DIAGNOSIS — I10 Essential (primary) hypertension: Secondary | ICD-10-CM

## 2023-07-06 DIAGNOSIS — Z2821 Immunization not carried out because of patient refusal: Secondary | ICD-10-CM

## 2023-07-06 DIAGNOSIS — E782 Mixed hyperlipidemia: Secondary | ICD-10-CM | POA: Diagnosis not present

## 2023-07-06 DIAGNOSIS — Z6841 Body Mass Index (BMI) 40.0 and over, adult: Secondary | ICD-10-CM

## 2023-07-06 DIAGNOSIS — R7303 Prediabetes: Secondary | ICD-10-CM | POA: Diagnosis not present

## 2023-07-06 DIAGNOSIS — E66813 Obesity, class 3: Secondary | ICD-10-CM

## 2023-07-06 MED ORDER — AMLODIPINE BESYLATE 2.5 MG PO TABS
2.5000 mg | ORAL_TABLET | Freq: Every day | ORAL | 1 refills | Status: DC
  Filled 2023-07-06: qty 30, 30d supply, fill #0

## 2023-07-06 NOTE — Patient Instructions (Signed)
 Check your blood pressure daily for one week and send us  the readings Amlodipine  may cause some swelling allow 2 weeks to resolve, make sure you are staying well hydrated with water.

## 2023-07-06 NOTE — Progress Notes (Signed)
 [place], CMA,acting as a Neurosurgeon for Susanna Epley, FNP.,have documented all relevant documentation on the behalf of Susanna Epley, FNP,as directed by  Susanna Epley, FNP while in the presence of Susanna Epley, FNP.  Subjective:  Patient ID: Grant Hernandez , male    DOB: [birth year] , 48 y.o.   MRN: [id number]  Chief Complaint  Patient presents with   Hypertension    Patient presents today for a bp and pre dm follow up, Patient reports compliance with medication. Patient denies any chest pain, SOB, or headaches. Patient has no concerns today.     HPI  He reports his weight at home is 266 lbs. Limited amount of exercising. Continues his f/u for his myasthenia gravis. He was diagnosed with mild sleep apnea and he trialed CPAP but did not like it.     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, also trouble urinating post op   Diabetes mellitus without complication (HCC)    Erectile dysfunction 11/30/2018   Hypertension    MRSA (methicillin resistant Staphylococcus aureus) 2010   right axilla   Myasthenia gravis (HCC)    Pectoralis muscle rupture      Family History  Problem Relation Age of Onset   Healthy Mother    Other Father        unknown   Colon cancer Neg Hx    Esophageal cancer Neg Hx    Stomach cancer Neg Hx    Rectal cancer Neg Hx      Current Outpatient Medications:    metFORMIN  (GLUCOPHAGE ) 500 MG tablet, Take 1 tablet (500 mg total) by mouth daily with breakfast., Disp: 90 tablet, Rfl: 2   mycophenolate  (CELLCEPT ) 250 MG capsule, Take 1 capsule (250 mg total) by mouth 2 (two) times daily Take with 2- 500 mg tablets twice daily for total of 1250 mg twice daily, Disp: 180 capsule, Rfl: 3   mycophenolate  (CELLCEPT ) 500 MG tablet, Take 2 tablets (1,000 mg total) by mouth 2 (two) times daily. Take with 250 mg twice daily for total of 1250 mg twice daily, Disp: 360 tablet, Rfl: 3    pyridostigmine  (MESTINON ) 60 MG tablet, Take 1.5 tablets (90 mg total) by mouth 3 (three) times daily, Disp: 405 tablet, Rfl: 3   pyridostigmine  (MESTINON ) 60 MG tablet, Take 1.5 tablets (90 mg total) by mouth 4 (four) times daily as needed., Disp: 540 tablet, Rfl: 3   VITAMIN D  PO, Take by mouth., Disp: , Rfl:    olmesartan  (BENICAR ) 20 MG tablet, Take 1/2 tablet by mouth daily, Disp: 45 tablet, Rfl: 1   predniSONE  (DELTASONE ) 5 MG tablet, Take 1 tablet (5 mg total) by mouth every Monday, Wednesday, and Friday (Patient not taking: Reported on 07/06/2023), Disp: 36 tablet, Rfl: 3   No Known Allergies   Review of Systems  Constitutional: Negative.   HENT: Negative.    Respiratory: Negative.    Cardiovascular: Negative.   Gastrointestinal: Negative.   Skin: Negative.   Allergic/Immunologic: Negative.   Neurological: Negative.   Hematological: Negative.      Today's Vitals   07/06/23 1514 07/06/23 1559  BP: (!) 140/82 130/80  Pulse: 73   Temp: 98.7 F (37.1 C)   TempSrc: Oral   Weight: 279 lb (126.6 kg)   Height: 5\' 10"  (1.778 m)   PainSc: 0-No pain    Body mass index is 40.03 kg/m.  Wt Readings  from Last 3 Encounters:  07/06/23 279 lb (126.6 kg)  01/06/23 270 lb (122.5 kg)  09/02/22 246 lb 9.6 oz (111.9 kg)      Objective:  Physical Exam Vitals and nursing note reviewed.  Constitutional:      General: He is not in acute distress.    Appearance: Normal appearance. He is obese.  HENT:     Head: Normocephalic.  Cardiovascular:     Rate and Rhythm: Normal rate and regular rhythm.     Pulses: Normal pulses.     Heart sounds: Normal heart sounds. No murmur heard. Pulmonary:     Effort: Pulmonary effort is normal. No respiratory distress.     Breath sounds: Normal breath sounds. No wheezing.  Skin:    General: Skin is warm and dry.     Findings: No rash.  Neurological:     General: No focal deficit present.     Mental Status: He is alert and oriented to person,  place, and time.     Cranial Nerves: No cranial nerve deficit.  Psychiatric:        Mood and Affect: Mood normal.        Behavior: Behavior normal.        Thought Content: Thought content normal.        Judgment: Judgment normal.         Assessment And Plan:  Primary hypertension Assessment & Plan: Blood pressure is fairly controlled, his blood pressure have been slightly more elevated will change to amlodipine  from lisinopril . Check BP daily   Orders: -     BMP8+eGFR  Prediabetes Assessment & Plan: HgbA1c was increased to 6.2 at last visit, will check again today. Encouraged to limit intake of sugary foods and drinks. Continue exercising regularly.   Orders: -     Hemoglobin A1c  Mixed hyperlipidemia Assessment & Plan: Cholesterol levels are stable.   Orders: -     Lipid panel  Morbid obesity with BMI of 40.0-44.9, adult [facility]) Assessment & Plan: He is encouraged to strive for BMI less than 30 to decrease cardiac risk. Advised to aim for at least 150 minutes of exercise per week.    COVID-19 vaccination declined Assessment & Plan: Declines covid 19 vaccine. Discussed risk of covid 51 and if he changes her mind about the vaccine to call the office. Education has been provided regarding the importance of this vaccine but patient still declined. Advised may receive this vaccine at local pharmacy or Health Dept.or vaccine clinic. Aware to provide a copy of the vaccination record if obtained from local pharmacy or Health Dept.  Encouraged to take multivitamin, vitamin d , vitamin c and zinc to increase immune system. Aware can call office if would like to have vaccine here at office. Verbalized acceptance and understanding.      Return for keep same next; 6 week BP f/u.  Patient was given opportunity to ask questions. Patient verbalized understanding of the plan and was able to repeat key elements of the plan. All questions were answered to their satisfaction.    Inge Mangle, FNP, have reviewed all documentation for this visit. The documentation on 07/06/23 for the exam, diagnosis, procedures, and orders are all accurate and complete.   IF YOU HAVE BEEN REFERRED TO A SPECIALIST, IT MAY TAKE 1-2 WEEKS TO SCHEDULE/PROCESS THE REFERRAL. IF YOU HAVE NOT HEARD FROM US /SPECIALIST IN TWO WEEKS, PLEASE GIVE US  A CALL AT [phone].

## 2023-07-07 ENCOUNTER — Other Ambulatory Visit (HOSPITAL_BASED_OUTPATIENT_CLINIC_OR_DEPARTMENT_OTHER): Payer: Self-pay

## 2023-07-07 ENCOUNTER — Encounter: Payer: Self-pay | Admitting: Nurse Practitioner

## 2023-07-07 ENCOUNTER — Other Ambulatory Visit: Payer: Self-pay | Admitting: Nurse Practitioner

## 2023-07-07 ENCOUNTER — Other Ambulatory Visit: Payer: Self-pay

## 2023-07-07 DIAGNOSIS — I10 Essential (primary) hypertension: Secondary | ICD-10-CM

## 2023-07-07 LAB — HEMOGLOBIN A1C
Est. average glucose Bld gHb Est-mCnc: 128 mg/dL
Hgb A1c MFr Bld: 6.1 % — ABNORMAL HIGH (ref 4.8–5.6)

## 2023-07-07 LAB — BMP8+EGFR
BUN/Creatinine Ratio: 14 (ref 9–20)
BUN: 16 mg/dL (ref 6–24)
CO2: 26 mmol/L (ref 20–29)
Calcium: 9.4 mg/dL (ref 8.7–10.2)
Chloride: 105 mmol/L (ref 96–106)
Creatinine, Ser: 1.11 mg/dL (ref 0.76–1.27)
Glucose: 83 mg/dL (ref 70–99)
Potassium: 4.3 mmol/L (ref 3.5–5.2)
Sodium: 142 mmol/L (ref 134–144)
eGFR: 81 mL/min/{1.73_m2} (ref >59)

## 2023-07-07 LAB — LIPID PANEL
Chol/HDL Ratio: 3.3 ratio (ref 0.0–5.0)
Cholesterol, Total: 150 mg/dL (ref 100–199)
HDL: 45 mg/dL (ref >39)
LDL Chol Calc (NIH): 89 mg/dL (ref 0–99)
Triglycerides: 86 mg/dL (ref 0–149)
VLDL Cholesterol Cal: 16 mg/dL (ref 5–40)

## 2023-07-07 MED ORDER — OLMESARTAN MEDOXOMIL 20 MG PO TABS
ORAL_TABLET | ORAL | 1 refills | Status: DC
  Filled 2023-07-07: qty 15, 30d supply, fill #0
  Filled 2023-08-03: qty 15, 30d supply, fill #1
  Filled 2023-09-02 – 2023-09-06 (×2): qty 15, 30d supply, fill #2
  Filled 2023-10-01: qty 15, 30d supply, fill #3

## 2023-07-09 ENCOUNTER — Other Ambulatory Visit (HOSPITAL_BASED_OUTPATIENT_CLINIC_OR_DEPARTMENT_OTHER): Payer: Self-pay

## 2023-07-17 DIAGNOSIS — Z2821 Immunization not carried out because of patient refusal: Secondary | ICD-10-CM | POA: Insufficient documentation

## 2023-07-17 NOTE — Assessment & Plan Note (Signed)
 He is encouraged to strive for BMI less than 30 to decrease cardiac risk. Advised to aim for at least 150 minutes of exercise per week.

## 2023-07-17 NOTE — Assessment & Plan Note (Signed)
 HgbA1c was increased to 6.2 at last visit, will check again today. Encouraged to limit intake of sugary foods and drinks. Continue exercising regularly.

## 2023-07-17 NOTE — Assessment & Plan Note (Signed)

## 2023-07-17 NOTE — Assessment & Plan Note (Signed)
 Cholesterol levels are stable

## 2023-07-17 NOTE — Assessment & Plan Note (Addendum)
 Blood pressure is fairly controlled, his blood pressure have been slightly more elevated will change to amlodipine  from lisinopril . Check BP daily

## 2023-07-26 ENCOUNTER — Other Ambulatory Visit (HOSPITAL_BASED_OUTPATIENT_CLINIC_OR_DEPARTMENT_OTHER): Payer: Self-pay

## 2023-08-06 ENCOUNTER — Other Ambulatory Visit (HOSPITAL_BASED_OUTPATIENT_CLINIC_OR_DEPARTMENT_OTHER): Payer: Self-pay

## 2023-08-25 ENCOUNTER — Ambulatory Visit: Admitting: Nurse Practitioner

## 2023-08-25 ENCOUNTER — Encounter: Payer: Self-pay | Admitting: Nurse Practitioner

## 2023-08-25 VITALS — BP 140/80 | HR 79 | Temp 98.8°F | Ht 70.0 in | Wt 288.0 lb

## 2023-08-25 DIAGNOSIS — R7303 Prediabetes: Secondary | ICD-10-CM

## 2023-08-25 DIAGNOSIS — Z6841 Body Mass Index (BMI) 40.0 and over, adult: Secondary | ICD-10-CM

## 2023-08-25 DIAGNOSIS — E782 Mixed hyperlipidemia: Secondary | ICD-10-CM | POA: Diagnosis not present

## 2023-08-25 DIAGNOSIS — E66813 Obesity, class 3: Secondary | ICD-10-CM | POA: Diagnosis not present

## 2023-08-25 DIAGNOSIS — M5441 Lumbago with sciatica, right side: Secondary | ICD-10-CM | POA: Diagnosis not present

## 2023-08-25 DIAGNOSIS — I10 Essential (primary) hypertension: Secondary | ICD-10-CM | POA: Diagnosis not present

## 2023-08-25 MED ORDER — KETOROLAC TROMETHAMINE 30 MG/ML IJ SOLN
30.0000 mg | Freq: Once | INTRAMUSCULAR | Status: AC
  Administered 2023-08-25: 30 mg via INTRAMUSCULAR

## 2023-08-25 NOTE — Patient Instructions (Signed)
 Be aware toradol  injection and mestinon  may cause an increase in GI side effects, low risk.

## 2023-08-25 NOTE — Progress Notes (Signed)
 LILLETTE Kristeen JINNY Gladis, CMA,acting as a Neurosurgeon for Grant Ada, FNP.,have documented all relevant documentation on the behalf of Grant Ada, FNP,as directed by  Grant Ada, FNP while in the presence of Grant Ada, FNP.  Subjective:  Patient ID: Grant Hernandez , male    DOB: 06-Jan-1976 , 48 y.o.   MRN: [id number]  Chief Complaint  Patient presents with   Hypertension    Patient presents today for a bp and chol follow up, Patient reports compliance with medication. Patient denies any chest pain, SOB, or headaches. Patient has no concerns today.    Hyperlipidemia   Back Pain    Patient reports his back started hurting about 2 weeks ago and it has just gotten worse over time.     HPI  Here for f/u BP.    Back Pain This is a new problem. The current episode started 1 to 4 weeks ago. The problem has been waxing and waning since onset. The pain is present in the sacro-iliac. The quality of the pain is described as shooting. The symptoms are aggravated by sitting and position. Pertinent negatives include no abdominal pain or chest pain.     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, also trouble urinating post op   Diabetes mellitus without complication (HCC)    Erectile dysfunction 11/30/2018   Hypertension    MRSA (methicillin resistant Staphylococcus aureus) 2010   right axilla   Myasthenia gravis (HCC)    Pectoralis muscle rupture      Family History  Problem Relation Age of Onset   Healthy Mother    Other Father        unknown   Colon cancer Neg Hx    Esophageal cancer Neg Hx    Stomach cancer Neg Hx    Rectal cancer Neg Hx      Current Outpatient Medications:    metFORMIN  (GLUCOPHAGE ) 500 MG tablet, Take 1 tablet (500 mg total) by mouth daily with breakfast., Disp: 90 tablet, Rfl: 2   mycophenolate  (CELLCEPT ) 250 MG capsule, Take 1 capsule (250 mg total) by mouth 2 (two) times daily Take  with 2- 500 mg tablets twice daily for total of 1250 mg twice daily, Disp: 180 capsule, Rfl: 3   mycophenolate  (CELLCEPT ) 500 MG tablet, Take 2 tablets (1,000 mg total) by mouth 2 (two) times daily. Take with 250 mg twice daily for total of 1250 mg twice daily, Disp: 360 tablet, Rfl: 3   olmesartan  (BENICAR ) 20 MG tablet, Take 1/2 tablet by mouth daily, Disp: 45 tablet, Rfl: 1   pyridostigmine  (MESTINON ) 60 MG tablet, Take 1.5 tablets (90 mg total) by mouth 3 (three) times daily, Disp: 405 tablet, Rfl: 3   pyridostigmine  (MESTINON ) 60 MG tablet, Take 1.5 tablets (90 mg total) by mouth 4 (four) times daily as needed., Disp: 540 tablet, Rfl: 3   VITAMIN D  PO, Take by mouth., Disp: , Rfl:    No Known Allergies   Review of Systems  Constitutional: Negative.   Respiratory: Negative.    Cardiovascular:  Negative for chest pain.  Gastrointestinal:  Negative for abdominal pain.  Genitourinary: Negative.   Musculoskeletal:  Positive for back pain.  Neurological: Negative.   Psychiatric/Behavioral: Negative.       Today's Vitals   08/25/23 1522 08/25/23 1628  BP: (!) 140/80   Pulse: 79   Temp: 98.8 F (37.1 C)   TempSrc: Oral  Weight: 288 lb (130.6 kg)   Height: 5' 10 (1.778 m)   PainSc: 9  7   PainLoc: Back    Body mass index is 41.32 kg/m.  Wt Readings from Last 3 Encounters:  08/25/23 288 lb (130.6 kg)  07/06/23 279 lb (126.6 kg)  01/06/23 270 lb (122.5 kg)    The 10-year ASCVD risk score (Arnett DK, et al., 2019) is: 9.3%   Values used to calculate the score:     Age: 30 years     Clincally relevant sex: Male     Is Non-Hispanic African American: Yes     Diabetic: No     Tobacco smoker: No     Systolic Blood Pressure: 140 mmHg     Is BP treated: Yes     HDL Cholesterol: 45 mg/dL     Total Cholesterol: 150 mg/dL  Objective:  Physical Exam Vitals and nursing note reviewed.  Constitutional:      General: He is not in acute distress.    Appearance: Normal appearance.  He is obese.  HENT:     Head: Normocephalic.   Cardiovascular:     Rate and Rhythm: Normal rate and regular rhythm.     Pulses: Normal pulses.     Heart sounds: Normal heart sounds. No murmur heard. Pulmonary:     Effort: Pulmonary effort is normal. No respiratory distress.     Breath sounds: Normal breath sounds. No wheezing.   Musculoskeletal:        General: Tenderness (low back) present. No swelling.     Comments: Negative straight leg raise.    Skin:    General: Skin is warm and dry.     Findings: No rash.   Neurological:     General: No focal deficit present.     Mental Status: He is alert and oriented to person, place, and time.     Cranial Nerves: No cranial nerve deficit.     Motor: No weakness.   Psychiatric:        Mood and Affect: Mood normal.        Behavior: Behavior normal.        Thought Content: Thought content normal.        Judgment: Judgment normal.        Assessment And Plan:  Primary hypertension Assessment & Plan: We had switched his BP medications to olmesartan  however this will affect his Myasthenia Gravis, he is continuing his current medications. Blood pressure is fairly controlled. Encouraged to limit intake of salt.   Mixed hyperlipidemia Assessment & Plan: Cholesterol levels are stable. Continue focusing on low fat diet   Class 3 severe obesity due to excess calories without serious comorbidity with body mass index (BMI) of 40.0 to 44.9 in adult Assessment & Plan: He is encouraged to strive for BMI less than 30 to decrease cardiac risk. Advised to aim for at least 150 minutes of exercise per week.    Acute right-sided low back pain with right-sided sciatica Assessment & Plan: Flared after lifting heavy weights, will treat with steroid injection. Encouraged to stretch regularly.   Orders: -     Ketorolac  Tromethamine     Return for keep same next.  Patient was given opportunity to ask questions. Patient verbalized understanding  of the plan and was able to repeat key elements of the plan. All questions were answered to their satisfaction.     LILLETTE Grant Ada, FNP, have reviewed all documentation for this visit. The documentation  on 08/25/23 for the exam, diagnosis, procedures, and orders are all accurate and complete.  IF YOU HAVE BEEN REFERRED TO A SPECIALIST, IT MAY TAKE 1-2 WEEKS TO SCHEDULE/PROCESS THE REFERRAL. IF YOU HAVE NOT HEARD FROM US /SPECIALIST IN TWO WEEKS, PLEASE GIVE US  A CALL AT [phone].

## 2023-09-02 ENCOUNTER — Other Ambulatory Visit (HOSPITAL_BASED_OUTPATIENT_CLINIC_OR_DEPARTMENT_OTHER): Payer: Self-pay

## 2023-09-02 ENCOUNTER — Other Ambulatory Visit: Payer: Self-pay

## 2023-09-04 DIAGNOSIS — Z6841 Body Mass Index (BMI) 40.0 and over, adult: Secondary | ICD-10-CM | POA: Insufficient documentation

## 2023-09-04 DIAGNOSIS — M5441 Lumbago with sciatica, right side: Secondary | ICD-10-CM | POA: Insufficient documentation

## 2023-09-04 NOTE — Assessment & Plan Note (Signed)
 Cholesterol levels are stable.  Continue focusing on low-fat diet.

## 2023-09-04 NOTE — Assessment & Plan Note (Signed)
 He is encouraged to strive for BMI less than 30 to decrease cardiac risk. Advised to aim for at least 150 minutes of exercise per week.

## 2023-09-04 NOTE — Assessment & Plan Note (Signed)
 Flared after lifting heavy weights, will treat with steroid injection. Encouraged to stretch regularly.

## 2023-09-04 NOTE — Assessment & Plan Note (Signed)
 We had switched his BP medications to olmesartan  however this will affect his Myasthenia Gravis, he is continuing his current medications. Blood pressure is fairly controlled. Encouraged to limit intake of salt.

## 2023-09-05 ENCOUNTER — Other Ambulatory Visit (HOSPITAL_BASED_OUTPATIENT_CLINIC_OR_DEPARTMENT_OTHER): Payer: Self-pay

## 2023-09-06 ENCOUNTER — Other Ambulatory Visit: Payer: Self-pay

## 2023-09-06 ENCOUNTER — Other Ambulatory Visit (HOSPITAL_BASED_OUTPATIENT_CLINIC_OR_DEPARTMENT_OTHER): Payer: Self-pay

## 2023-09-06 MED ORDER — PYRIDOSTIGMINE BROMIDE 60 MG PO TABS
90.0000 mg | ORAL_TABLET | Freq: Four times a day (QID) | ORAL | 3 refills | Status: AC
  Filled 2023-09-06: qty 180, 30d supply, fill #0
  Filled 2023-10-01: qty 180, 30d supply, fill #1
  Filled 2023-11-01: qty 180, 30d supply, fill #2
  Filled 2023-11-30: qty 180, 30d supply, fill #3
  Filled 2023-12-30: qty 180, 30d supply, fill #4
  Filled 2024-02-28: qty 180, 30d supply, fill #5

## 2023-09-06 MED ORDER — PYRIDOSTIGMINE BROMIDE 60 MG PO TABS
60.0000 mg | ORAL_TABLET | Freq: Four times a day (QID) | ORAL | 3 refills | Status: DC
  Filled 2023-09-06: qty 360, 90d supply, fill #0

## 2023-10-05 ENCOUNTER — Other Ambulatory Visit (HOSPITAL_BASED_OUTPATIENT_CLINIC_OR_DEPARTMENT_OTHER): Payer: Self-pay

## 2023-10-13 ENCOUNTER — Other Ambulatory Visit (HOSPITAL_BASED_OUTPATIENT_CLINIC_OR_DEPARTMENT_OTHER): Payer: Self-pay

## 2023-10-20 ENCOUNTER — Encounter: Payer: Self-pay | Admitting: Nurse Practitioner

## 2023-10-21 ENCOUNTER — Ambulatory Visit: Payer: Self-pay

## 2023-10-25 ENCOUNTER — Other Ambulatory Visit (HOSPITAL_BASED_OUTPATIENT_CLINIC_OR_DEPARTMENT_OTHER): Payer: Self-pay

## 2023-10-25 ENCOUNTER — Ambulatory Visit: Payer: Self-pay

## 2023-10-25 VITALS — BP 130/96 | HR 73 | Temp 98.5°F | Ht 70.0 in | Wt 288.0 lb

## 2023-10-25 DIAGNOSIS — I10 Essential (primary) hypertension: Secondary | ICD-10-CM

## 2023-10-25 MED ORDER — OLMESARTAN MEDOXOMIL 20 MG PO TABS
20.0000 mg | ORAL_TABLET | Freq: Every day | ORAL | 1 refills | Status: DC
  Filled 2023-10-25: qty 30, 30d supply, fill #0
  Filled 2023-11-24: qty 30, 30d supply, fill #1

## 2023-10-25 NOTE — Progress Notes (Signed)
 Patient is in office today for a nurse visit for Blood Pressure Check. Patient currently taking olmesartan  20mg  PM. Patient blood pressure was 130/100, Patient No chest pain, No shortness of breath, No dyspnea on exertion, No orthopnea, No paroxysmal nocturnal dyspnea, No edema, No palpitations, No syncope BP Readings from Last 3 Encounters:  10/25/23 (!) 130/100  08/25/23 (!) 140/80  07/06/23 130/80   Per provider- Continue taking 1 tablet of olmesartan  20mg - f/u 2 weeks NV.

## 2023-11-15 ENCOUNTER — Ambulatory Visit: Payer: Self-pay

## 2023-11-15 VITALS — BP 140/92 | HR 84 | Temp 98.4°F | Ht 70.0 in | Wt 288.0 lb

## 2023-11-15 DIAGNOSIS — I10 Essential (primary) hypertension: Secondary | ICD-10-CM

## 2023-11-15 DIAGNOSIS — G7000 Myasthenia gravis without (acute) exacerbation: Secondary | ICD-10-CM

## 2023-11-15 NOTE — Progress Notes (Signed)
 Patient presents today for a bpc. Patient reports compliance with his meds. Patient reports he is taking olmesartan  20mg  in the evenings. He reports he hasn't taken his medication yet. I checked his blood pressure and it was 140/90 P84. Patient was advised he will be referred to the hypertension clinic to be evaluated for his bp.    BP Readings from Last 3 Encounters:  10/25/23 (!) 130/96  08/25/23 (!) 140/80  07/06/23 130/80

## 2023-11-15 NOTE — Patient Instructions (Signed)
 Hypertension, Adult Hypertension is another name for high blood pressure. High blood pressure forces your heart to work harder to pump blood. This can cause problems over time. There are two numbers in a blood pressure reading. There is a top number (systolic) over a bottom number (diastolic). It is best to have a blood pressure that is below 120/80. What are the causes? The cause of this condition is not known. Some other conditions can lead to high blood pressure. What increases the risk? Some lifestyle factors can make you more likely to develop high blood pressure: Smoking. Not getting enough exercise or physical activity. Being overweight. Having too much fat, sugar, calories, or salt (sodium) in your diet. Drinking too much alcohol. Other risk factors include: Having any of these conditions: Heart disease. Diabetes. High cholesterol. Kidney disease. Obstructive sleep apnea. Having a family history of high blood pressure and high cholesterol. Age. The risk increases with age. Stress. What are the signs or symptoms? High blood pressure may not cause symptoms. Very high blood pressure (hypertensive crisis) may cause: Headache. Fast or uneven heartbeats (palpitations). Shortness of breath. Nosebleed. Vomiting or feeling like you may vomit (nauseous). Changes in how you see. Very bad chest pain. Feeling dizzy. Seizures. How is this treated? This condition is treated by making healthy lifestyle changes, such as: Eating healthy foods. Exercising more. Drinking less alcohol. Your doctor may prescribe medicine if lifestyle changes do not help enough and if: Your top number is above 130. Your bottom number is above 80. Your personal target blood pressure may vary. Follow these instructions at home: Eating and drinking  If told, follow the DASH eating plan. To follow this plan: Fill one half of your plate at each meal with fruits and vegetables. Fill one fourth of your plate  at each meal with whole grains. Whole grains include whole-wheat pasta, brown rice, and whole-grain bread. Eat or drink low-fat dairy products, such as skim milk or low-fat yogurt. Fill one fourth of your plate at each meal with low-fat (lean) proteins. Low-fat proteins include fish, chicken without skin, eggs, beans, and tofu. Avoid fatty meat, cured and processed meat, or chicken with skin. Avoid pre-made or processed food. Limit the amount of salt in your diet to less than 1,500 mg each day. Do not drink alcohol if: Your doctor tells you not to drink. You are pregnant, may be pregnant, or are planning to become pregnant. If you drink alcohol: Limit how much you have to: 0-1 drink a day for women. 0-2 drinks a day for men. Know how much alcohol is in your drink. In the [place] one drink equals one 12 oz bottle of beer (355 mL), one 5 oz glass of wine (148 mL), or one 1 oz glass of hard liquor (44 mL). Lifestyle  Work with your doctor to stay at a healthy weight or to lose weight. Ask your doctor what the best weight is for you. Get at least 30 minutes of exercise that causes your heart to beat faster (aerobic exercise) most days of the week. This may include walking, swimming, or biking. Get at least 30 minutes of exercise that strengthens your muscles (resistance exercise) at least 3 days a week. This may include lifting weights or doing Pilates. Do not smoke or use any products that contain nicotine or tobacco. If you need help quitting, ask your doctor. Check your blood pressure at home as told by your doctor. Keep all follow-up visits. Medicines Take over-the-counter and prescription medicines  only as told by your doctor. Follow directions carefully. Do not skip doses of blood pressure medicine. The medicine does not work as well if you skip doses. Skipping doses also puts you at risk for problems. Ask your doctor about side effects or reactions to medicines that you should watch  for. Contact a doctor if: You think you are having a reaction to the medicine you are taking. You have headaches that keep coming back. You feel dizzy. You have swelling in your ankles. You have trouble with your vision. Get help right away if: You get a very bad headache. You start to feel mixed up (confused). You feel weak or numb. You feel faint. You have very bad pain in your: Chest. Belly (abdomen). You vomit more than once. You have trouble breathing. These symptoms may be an emergency. Get help right away. Call 911. Do not wait to see if the symptoms will go away. Do not drive yourself to the hospital. Summary Hypertension is another name for high blood pressure. High blood pressure forces your heart to work harder to pump blood. For most people, a normal blood pressure is less than 120/80. Making healthy choices can help lower blood pressure. If your blood pressure does not get lower with healthy choices, you may need to take medicine. This information is not intended to replace advice given to you by your health care provider. Make sure you discuss any questions you have with your health care provider. Document Revised: 12/11/2020 Document Reviewed: 12/11/2020 Elsevier Patient Education  2024 [employer].

## 2023-11-28 ENCOUNTER — Other Ambulatory Visit (HOSPITAL_BASED_OUTPATIENT_CLINIC_OR_DEPARTMENT_OTHER): Payer: Self-pay

## 2023-11-30 ENCOUNTER — Other Ambulatory Visit: Payer: Self-pay

## 2023-11-30 ENCOUNTER — Other Ambulatory Visit (HOSPITAL_BASED_OUTPATIENT_CLINIC_OR_DEPARTMENT_OTHER): Payer: Self-pay

## 2023-11-30 ENCOUNTER — Other Ambulatory Visit: Payer: Self-pay | Admitting: Family Medicine

## 2023-11-30 DIAGNOSIS — R7303 Prediabetes: Secondary | ICD-10-CM

## 2023-11-30 MED ORDER — METFORMIN HCL 500 MG PO TABS
500.0000 mg | ORAL_TABLET | Freq: Every day | ORAL | 2 refills | Status: AC
  Filled 2023-11-30: qty 30, 30d supply, fill #0
  Filled 2023-12-30: qty 30, 30d supply, fill #1
  Filled 2024-01-29: qty 30, 30d supply, fill #2
  Filled 2024-02-28: qty 30, 30d supply, fill #3

## 2023-12-21 ENCOUNTER — Other Ambulatory Visit (HOSPITAL_BASED_OUTPATIENT_CLINIC_OR_DEPARTMENT_OTHER): Payer: Self-pay

## 2023-12-21 ENCOUNTER — Other Ambulatory Visit: Payer: Self-pay | Admitting: Nurse Practitioner

## 2023-12-21 DIAGNOSIS — I10 Essential (primary) hypertension: Secondary | ICD-10-CM

## 2023-12-21 MED ORDER — OLMESARTAN MEDOXOMIL 20 MG PO TABS
20.0000 mg | ORAL_TABLET | Freq: Every day | ORAL | 1 refills | Status: DC
  Filled 2023-12-21: qty 30, 30d supply, fill #0
  Filled 2024-01-29: qty 30, 30d supply, fill #1

## 2024-01-04 ENCOUNTER — Other Ambulatory Visit (HOSPITAL_BASED_OUTPATIENT_CLINIC_OR_DEPARTMENT_OTHER): Payer: Self-pay

## 2024-01-10 NOTE — Progress Notes (Signed)
 LILLETTE Kristeen JINNY Gladis, CMA,acting as a neurosurgeon for Grant Ada, FNP.,have documented all relevant documentation on the behalf of Grant Ada, FNP,as directed by  Grant Ada, FNP while in the presence of Grant Ada, FNP.  Subjective:   Patient ID: Grant Hernandez , male    DOB: Jul 25, 1975 , 48 y.o.   MRN: [id number]  Chief Complaint  Patient presents with   Annual Exam    Patient presents today for HM, Patient reports compliance with medication. Patient denies any chest pain, SOB, or headaches.    Headache    Patient reports he would like to get a MRI due to increased headaches. He reports about 3 times a week he has a headache throughout the day.     HPI Discussed the use of AI scribe software for clinical note transcription with the patient, who gave verbal consent to proceed.  History of Present Illness Grant Hernandez is a 48 year old male who presents for an annual physical exam.  He has a history of ocular myasthenia gravis and is starting a new drug trial on Thursday for a medication that has shown improvement in patients with generalized myasthenia. He will continue his current medications, and the new drug will be an addition to his regimen.  He experiences headaches three times a week, which have been ongoing for years. The headaches are located temporally and occipitally, with associated nausea and light sensitivity. He previously saw a headache specialist who treated him for migraines with Maxalt, but he has not been on any daily medication since. He manages them with over-the-counter Excedrin, which he states is effective.  He has a history of high blood pressure and is scheduled to see a cardiologist on December 3rd. He mentions that his blood pressure is currently at the borderline level. He has had EKGs done for a drug study.  In terms of lifestyle, he is not exercising regularly, walking maybe once a week, and follows a low-carb diet with no fried foods and higher protein  intake. He has declined the COVID and shingles vaccines but has received the flu vaccine. No constipation, diarrhea, urinary issues, or swelling in the legs or ankles.    He is starting a drug trial on Thursday for his Myasthenia Gravis.   He was taking maxalt and no daily medications. He was seeing a headache specialist. He is not waking up with headaches. And there is no stopping it when this occurs. He will take excedrin.   Hypertension This is a chronic problem. The current episode started more than 1 year ago. The problem is controlled. Associated symptoms include headaches. Pertinent negatives include no anxiety, chest pain or palpitations. Risk factors for coronary artery disease include obesity. There is no history of angina. There is no history of chronic renal disease.  Headache  This is a chronic problem. The current episode started more than 1 year ago. The pain is located in the Temporal and occipital region. The quality of the pain is described as aching. Associated symptoms include nausea and photophobia. Pertinent negatives include no abdominal pain or back pain. He has tried Excedrin for the symptoms. The treatment provided significant relief. His past medical history is significant for hypertension, immunosuppression, migraine headaches and obesity.     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, also trouble urinating post op   Diabetes mellitus without complication (HCC)    Erectile  dysfunction 11/30/2018   Hypertension    MRSA (methicillin resistant Staphylococcus aureus) 2010   right axilla   Myasthenia gravis (HCC)    Pectoralis muscle rupture      Family History  Problem Relation Age of Onset   Healthy Mother    Other Father        unknown   Cancer Maternal Grandmother    Colon cancer Neg Hx    Esophageal cancer Neg Hx    Stomach cancer Neg Hx    Rectal cancer Neg Hx       Current Outpatient Medications:    metFORMIN  (GLUCOPHAGE ) 500 MG tablet, Take 1 tablet (500 mg total) by mouth daily with breakfast., Disp: 90 tablet, Rfl: 2   mycophenolate  (CELLCEPT ) 250 MG capsule, Take 1 capsule (250 mg total) by mouth 2 (two) times daily Take with 2- 500 mg tablets twice daily for total of 1250 mg twice daily, Disp: 180 capsule, Rfl: 3   mycophenolate  (CELLCEPT ) 500 MG tablet, Take 2 tablets (1,000 mg total) by mouth 2 (two) times daily. Take with 250 mg twice daily for total of 1250 mg twice daily, Disp: 360 tablet, Rfl: 3   olmesartan  (BENICAR ) 20 MG tablet, Take 1 tablet (20 mg total) by mouth daily., Disp: 30 tablet, Rfl: 1   pyridostigmine  (MESTINON ) 60 MG tablet, Take 1.5 tablets (90 mg total) by mouth 4 (four) times daily as needed., Disp: 540 tablet, Rfl: 3   pyridostigmine  (MESTINON ) 60 MG tablet, Take 1.5 tablets (90 mg total) by mouth 4 (four) times daily. 5:30am, 12pm, 3pm, 6pm (prior to eating and driving), Disp: 459 tablet, Rfl: 3   VITAMIN D  PO, Take by mouth., Disp: , Rfl:    No Known Allergies   Men's preventive visit. Patient Health Questionnaire (PHQ-2) is  Flowsheet Row Office Visit from 08/25/2023 in [facility]  PHQ-2 Total Score 0  Patient is on a low carb, no fried food and higher protein diet. Exercised maybe once a day. He does walk at times; not like he used to. Marital status: Married. Relevant history for alcohol use is:  Social History   Substance and Sexual Activity  Alcohol Use Yes   Comment: social   Relevant history for tobacco use is:  Social History   Tobacco Use  Smoking Status Never  Smokeless Tobacco Never  .   Review of Systems  Constitutional: Negative.   Eyes:  Positive for photophobia.  Respiratory: Negative.    Cardiovascular:  Negative for chest pain, palpitations and leg swelling.  Gastrointestinal:  Positive for nausea. Negative for abdominal pain.  Genitourinary:  Negative.   Musculoskeletal:  Negative for back pain.  Neurological:  Positive for headaches.  Psychiatric/Behavioral: Negative.       Today's Vitals   01/11/24 0834  BP: 130/74  Pulse: (!) 52  Temp: 97.8 F (36.6 C)  TempSrc: Oral  Weight: 293 lb (132.9 kg)  Height: 5' 10 (1.778 m)  PainSc: 0-No pain   Body mass index is 42.04 kg/m.  Wt Readings from Last 3 Encounters:  01/11/24 293 lb (132.9 kg)  11/15/23 288 lb (130.6 kg)  10/25/23 288 lb (130.6 kg)    Objective:  Physical Exam Vitals and nursing note reviewed.  Constitutional:      General: He is not in acute distress.    Appearance: Normal appearance. He is obese.  HENT:     Head: Normocephalic and atraumatic.     Right Ear: Tympanic membrane, ear  canal and external ear normal. There is no impacted cerumen.     Left Ear: Tympanic membrane, ear canal and external ear normal. There is no impacted cerumen.     Nose: Nose normal.     Mouth/Throat:     Mouth: Mucous membranes are moist.     Pharynx: Oropharynx is clear. No oropharyngeal exudate.  Eyes:     Extraocular Movements: Extraocular movements intact.     Right eye: Normal extraocular motion.     Left eye: Normal extraocular motion.     Conjunctiva/sclera: Conjunctivae normal.     Pupils: Pupils are equal, round, and reactive to light.  Cardiovascular:     Rate and Rhythm: Normal rate and regular rhythm.     Pulses: Normal pulses.     Heart sounds: Normal heart sounds. No murmur heard. Pulmonary:     Effort: Pulmonary effort is normal. No respiratory distress.     Breath sounds: Normal breath sounds. No wheezing.  Abdominal:     General: Abdomen is flat. Bowel sounds are normal. There is no distension.     Palpations: Abdomen is soft.     Tenderness: There is no abdominal tenderness.  Genitourinary:    Prostate: Normal.     Rectum: Guaiac result negative.  Musculoskeletal:        General: No swelling or tenderness. Normal range of motion.      Cervical back: Normal range of motion and neck supple.  Skin:    General: Skin is warm and dry.     Capillary Refill: Capillary refill takes less than 2 seconds.  Neurological:     General: No focal deficit present.     Mental Status: He is alert and oriented to person, place, and time.     Cranial Nerves: No cranial nerve deficit.     Motor: No weakness.  Psychiatric:        Mood and Affect: Mood normal.        Behavior: Behavior normal.        Thought Content: Thought content normal.        Judgment: Judgment normal.     Assessment And Plan:    Encounter for annual health examination Assessment & Plan: Routine visit with no significant changes. Declined COVID and shingles vaccines, received flu vaccine. EKG shows right atrial enlargement and low heart rate. - Health maintenance up to date except for declined COVID and shingles vaccines.    Primary hypertension Assessment & Plan: Blood pressure well-managed. EKG shows right atrial enlargement, possibly related to hypertension. - Continue current antihypertensive medications. - has a new consult with cardiologist on December 3rd for further evaluation. - EKG changed and now has right atrial enlargement and abnormal t abnormality  Orders: -     EKG 12-Lead -     POCT URINALYSIS DIP (CLINITEK) -     Microalbumin / creatinine urine ratio -     CMP14+EGFR  Prediabetes Assessment & Plan: HgbA1c was decreased to 6.1 at last visit, will check again today. Encouraged to limit intake of sugary foods and drinks. Continue exercising regularly.   Orders: -     Hemoglobin A1c  Myasthenia gravis (HCC) Assessment & Plan: Continue f/u with Neurology Participating in a drug trial for generalized myasthenia gravis with potential benefits for ocular myasthenia gravis. No changes to current medication regimen. - Continue current medications. - Proceed with drug trial as planned.   Mixed hyperlipidemia Assessment & Plan: Cholesterol  levels are stable. Continue focusing  on low fat diet  Orders: -     Lipid panel  Low vitamin D  level Assessment & Plan: Will check vitamin D  level and supplement as needed.    Also encouraged to spend 15 minutes in the sun daily.    Orders: -     VITAMIN D  25 Hydroxy (Vit-D Deficiency, Fractures)  Herpes zoster vaccination declined  Encounter for screening -     CBC with Differential/Platelet  Other headache syndrome -     MR BRAIN W WO CONTRAST; Future  Encounter for prostate cancer screening -     PSA  Persistent headaches Assessment & Plan: Chronic migraines three times a week with nausea and light sensitivity. Previously treated with Maxalt and seen at a headache specialist approximately 8-9 years ago. Requests MRI to rule out other causes. Excedrin provides some relief. - Ordered MRI at [facility] in [place] at the request of the patient - Prescribed Ubrelvy for acute migraine relief. - Provided samples of Ubrelvy to assess effectiveness.   Obstructive sleep apnea syndrome Assessment & Plan: He is not currently wearing a CPAP machine     Return for 1 year physical, 6 month bp check. Patient was given opportunity to ask questions. Patient verbalized understanding of the plan and was able to repeat key elements of the plan. All questions were answered to their satisfaction.   Grant Ada, FNP  I, Grant Ada, FNP, have reviewed all documentation for this visit. The documentation on 01/11/24 for the exam, diagnosis, procedures, and orders are all accurate and complete.

## 2024-01-11 ENCOUNTER — Encounter: Payer: Self-pay | Admitting: Nurse Practitioner

## 2024-01-11 ENCOUNTER — Ambulatory Visit (INDEPENDENT_AMBULATORY_CARE_PROVIDER_SITE_OTHER): Payer: Self-pay | Admitting: Nurse Practitioner

## 2024-01-11 VITALS — BP 130/74 | HR 52 | Temp 97.8°F | Ht 70.0 in | Wt 293.0 lb

## 2024-01-11 DIAGNOSIS — R7303 Prediabetes: Secondary | ICD-10-CM | POA: Diagnosis not present

## 2024-01-11 DIAGNOSIS — Z2821 Immunization not carried out because of patient refusal: Secondary | ICD-10-CM

## 2024-01-11 DIAGNOSIS — G7000 Myasthenia gravis without (acute) exacerbation: Secondary | ICD-10-CM | POA: Diagnosis not present

## 2024-01-11 DIAGNOSIS — Z139 Encounter for screening, unspecified: Secondary | ICD-10-CM

## 2024-01-11 DIAGNOSIS — G4733 Obstructive sleep apnea (adult) (pediatric): Secondary | ICD-10-CM

## 2024-01-11 DIAGNOSIS — I10 Essential (primary) hypertension: Secondary | ICD-10-CM

## 2024-01-11 DIAGNOSIS — Z125 Encounter for screening for malignant neoplasm of prostate: Secondary | ICD-10-CM

## 2024-01-11 DIAGNOSIS — R7989 Other specified abnormal findings of blood chemistry: Secondary | ICD-10-CM

## 2024-01-11 DIAGNOSIS — E782 Mixed hyperlipidemia: Secondary | ICD-10-CM

## 2024-01-11 DIAGNOSIS — G4489 Other headache syndrome: Secondary | ICD-10-CM

## 2024-01-11 DIAGNOSIS — Z0000 Encounter for general adult medical examination without abnormal findings: Secondary | ICD-10-CM | POA: Diagnosis not present

## 2024-01-11 DIAGNOSIS — R519 Headache, unspecified: Secondary | ICD-10-CM

## 2024-01-11 LAB — POCT URINALYSIS DIP (CLINITEK)
Bilirubin, UA: NEGATIVE
Blood, UA: NEGATIVE
Glucose, UA: NEGATIVE mg/dL
Ketones, POC UA: NEGATIVE mg/dL
Leukocytes, UA: NEGATIVE
Nitrite, UA: NEGATIVE
POC PROTEIN,UA: NEGATIVE
Spec Grav, UA: 1.02 (ref 1.010–1.025)
Urobilinogen, UA: 0.2 U/dL
pH, UA: 6 (ref 5.0–8.0)

## 2024-01-11 NOTE — Assessment & Plan Note (Signed)
 Chronic migraines three times a week with nausea and light sensitivity. Previously treated with Maxalt and seen at a headache specialist approximately 8-9 years ago. Requests MRI to rule out other causes. Excedrin provides some relief. - Ordered MRI at [facility] in [place] at the request of the patient - Prescribed Ubrelvy for acute migraine relief. - Provided samples of Ubrelvy to assess effectiveness.

## 2024-01-11 NOTE — Assessment & Plan Note (Signed)
 He is not currently wearing a CPAP machine

## 2024-01-11 NOTE — Assessment & Plan Note (Addendum)
 Blood pressure well-managed. EKG shows right atrial enlargement, possibly related to hypertension. - Continue current antihypertensive medications. - has a new consult with cardiologist on December 3rd for further evaluation. - EKG changed and now has right atrial enlargement and abnormal t abnormality

## 2024-01-11 NOTE — Assessment & Plan Note (Addendum)
 Continue f/u with Neurology Participating in a drug trial for generalized myasthenia gravis with potential benefits for ocular myasthenia gravis. No changes to current medication regimen. - Continue current medications. - Proceed with drug trial as planned.

## 2024-01-11 NOTE — Assessment & Plan Note (Signed)
 Will check vitamin D  level and supplement as needed.    Also encouraged to spend 15 minutes in the sun daily.

## 2024-01-11 NOTE — Assessment & Plan Note (Signed)
 Routine visit with no significant changes. Declined COVID and shingles vaccines, received flu vaccine. EKG shows right atrial enlargement and low heart rate. - Health maintenance up to date except for declined COVID and shingles vaccines.

## 2024-01-11 NOTE — Assessment & Plan Note (Signed)
 Cholesterol levels are stable.  Continue focusing on low-fat diet.

## 2024-01-11 NOTE — Assessment & Plan Note (Signed)
 HgbA1c was decreased to 6.1 at last visit, will check again today. Encouraged to limit intake of sugary foods and drinks. Continue exercising regularly.

## 2024-01-11 NOTE — Patient Instructions (Signed)
 Health Maintenance  Topic Date Due   COVID-19 Vaccine (4 - 2025-26 season) 01/27/2024*   Zoster (Shingles) Vaccine (1 of 2) 04/12/2024*   DTaP/Tdap/Td vaccine (4 - Td or Tdap) 08/29/2029   Colon Cancer Screening  01/24/2031   Pneumococcal Vaccine for age over 66  Completed   Flu Shot  Completed   Hepatitis B Vaccine  Completed   Hepatitis C Screening  Completed   HIV Screening  Completed   HPV Vaccine  Aged Out   Meningitis B Vaccine  Aged Out  *Topic was postponed. The date shown is not the original due date.

## 2024-01-12 LAB — CBC WITH DIFFERENTIAL/PLATELET
Basophils Absolute: 0 x10E3/uL (ref 0.0–0.2)
Basos: 1 %
EOS (ABSOLUTE): 0.1 x10E3/uL (ref 0.0–0.4)
Eos: 1 %
Hematocrit: 44.5 % (ref 37.5–51.0)
Hemoglobin: 13.9 g/dL (ref 13.0–17.7)
Immature Grans (Abs): 0 x10E3/uL (ref 0.0–0.1)
Immature Granulocytes: 0 %
Lymphocytes Absolute: 1.5 x10E3/uL (ref 0.7–3.1)
Lymphs: 35 %
MCH: 27.1 pg (ref 26.6–33.0)
MCHC: 31.2 g/dL — ABNORMAL LOW (ref 31.5–35.7)
MCV: 87 fL (ref 79–97)
Monocytes Absolute: 0.4 x10E3/uL (ref 0.1–0.9)
Monocytes: 9 %
Neutrophils Absolute: 2.4 x10E3/uL (ref 1.4–7.0)
Neutrophils: 54 %
Platelets: 273 x10E3/uL (ref 150–450)
RBC: 5.13 x10E6/uL (ref 4.14–5.80)
RDW: 13.7 % (ref 11.6–15.4)
WBC: 4.4 x10E3/uL (ref 3.4–10.8)

## 2024-01-12 LAB — LIPID PANEL
Chol/HDL Ratio: 3.1 ratio (ref 0.0–5.0)
Cholesterol, Total: 144 mg/dL (ref 100–199)
HDL: 46 mg/dL (ref >39)
LDL Chol Calc (NIH): 84 mg/dL (ref 0–99)
Triglycerides: 70 mg/dL (ref 0–149)
VLDL Cholesterol Cal: 14 mg/dL (ref 5–40)

## 2024-01-12 LAB — CMP14+EGFR
ALT: 16 IU/L (ref 0–44)
AST: 16 IU/L (ref 0–40)
Albumin: 4.7 g/dL (ref 4.1–5.1)
Alkaline Phosphatase: 66 IU/L (ref 47–123)
BUN/Creatinine Ratio: 17 (ref 9–20)
BUN: 19 mg/dL (ref 6–24)
Bilirubin Total: 0.4 mg/dL (ref 0.0–1.2)
CO2: 24 mmol/L (ref 20–29)
Calcium: 9.7 mg/dL (ref 8.7–10.2)
Chloride: 102 mmol/L (ref 96–106)
Creatinine, Ser: 1.09 mg/dL (ref 0.76–1.27)
Globulin, Total: 2.9 g/dL (ref 1.5–4.5)
Glucose: 92 mg/dL (ref 70–99)
Potassium: 4.6 mmol/L (ref 3.5–5.2)
Sodium: 138 mmol/L (ref 134–144)
Total Protein: 7.6 g/dL (ref 6.0–8.5)
eGFR: 83 mL/min/1.73 (ref >59)

## 2024-01-12 LAB — MICROALBUMIN / CREATININE URINE RATIO
Creatinine, Urine: 100.8 mg/dL
Microalb/Creat Ratio: 4 mg/g{creat} (ref 0–29)
Microalbumin, Urine: 4.2 ug/mL

## 2024-01-12 LAB — HEMOGLOBIN A1C
Est. average glucose Bld gHb Est-mCnc: 128 mg/dL
Hgb A1c MFr Bld: 6.1 % — ABNORMAL HIGH (ref 4.8–5.6)

## 2024-01-12 LAB — PSA: Prostate Specific Ag, Serum: 1.3 ng/mL (ref 0.0–4.0)

## 2024-01-12 LAB — VITAMIN D 25 HYDROXY (VIT D DEFICIENCY, FRACTURES): Vit D, 25-Hydroxy: 37.4 ng/mL (ref 30.0–100.0)

## 2024-02-08 ENCOUNTER — Ambulatory Visit (INDEPENDENT_AMBULATORY_CARE_PROVIDER_SITE_OTHER): Payer: Self-pay | Admitting: Family

## 2024-02-08 ENCOUNTER — Other Ambulatory Visit (HOSPITAL_BASED_OUTPATIENT_CLINIC_OR_DEPARTMENT_OTHER): Payer: Self-pay

## 2024-02-08 ENCOUNTER — Encounter (HOSPITAL_BASED_OUTPATIENT_CLINIC_OR_DEPARTMENT_OTHER): Payer: Self-pay | Admitting: Family

## 2024-02-08 VITALS — BP 154/82 | HR 80 | Ht 70.0 in | Wt 292.8 lb

## 2024-02-08 DIAGNOSIS — I10 Essential (primary) hypertension: Secondary | ICD-10-CM

## 2024-02-08 MED ORDER — OLMESARTAN MEDOXOMIL 40 MG PO TABS
40.0000 mg | ORAL_TABLET | Freq: Every day | ORAL | 1 refills | Status: AC
  Filled 2024-02-08: qty 30, 30d supply, fill #0
  Filled 2024-03-07: qty 30, 30d supply, fill #1

## 2024-02-08 NOTE — Patient Instructions (Signed)
 Medication Instructions:   INCREASE OLMESARTAN  TO 40 MG BY MOUTH DAILY   Labwork:  PLEASE HAVE LABS DONE IN 1-2 WEEKS--MAY HAVE DONE AT YOUR WORK (WE WILL PROVIDE THE REQUISITION SLIPS FOR YOU TO TAKE TO YOUR LAB AT THAT TIME)--BMET, Aldosterone + renin activity w/ ratio Catecholamines fractionated, plasma ; Metanephrines, plasma  AND TSH    Follow-Up: Please follow up in _2-3_ months in [facility] with Dr. Raford, Reche Finder, NP or Allean Mink PharmD --MAY BE IN-PERSON OR VIRTUAL

## 2024-02-08 NOTE — Progress Notes (Signed)
 [facility] Initial Assessment:    Date:  02/08/2024   ID:  Grant Hernandez, DOB October 12, 1975, MRN [id number]  PCP:  Georgina Speaks, FNP  Cardiologist:  None  Nephrologist:  Referring MD: Georgina Speaks, FNP   CC: Hypertension  History of Present Illness:    Discussed the use of AI scribe software for clinical note transcription with the patient, who gave verbal consent to proceed.  History of Present Illness Grant Hernandez is a 48 year old male with hypertension, HLD, Dm2, myasthenia gravis (ocular). Presents today to established with [facility]. 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   He was diagnosed with hypertension around 2019-2020. Home and work blood pressures are usually in the 130s, sometimes up to 140. BP checked at work is manual reading by [employer]. BP at home is arm cuff utilized on his forearm due to arm size. He takes olmesartan  20 mg daily. He previously used lisinopril  without issues and stopped amlodipine  due to swelling. Previously on Bystolic , does not recall adverse effect, discussed would not resume given contraindicated in myasthenia gravis. He denies chest pain, pressure, tightness, dyspnea with exertion, lightheadedness, dizziness, lower extremity edema, or syncope. No family history of hypertension, heart disease, or stroke.  He has ocular myasthenia gravis with double vision, eyelid drooping as his main symptom and is enrolled in a clinical study medication for this condition at [facility].  He has mild sleep apnea diagnosed on sleep study 2020 and was not recommended for CPAP at that time. He reports no snoring, most often wakes well rested, no significant daytime somnolence.   He has occasional brief palpitations with heart rate up to 135 bpm that resolve quickly on his own.  Echo 01/11/24 with auto read of possible right atrial enlargement. Independently reviewed, low suspicion for RA enlargement. Reassurance  provided.    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, also trouble urinating post op   Diabetes mellitus without complication (HCC)    Erectile dysfunction 11/30/2018   Hypertension    MRSA (methicillin resistant Staphylococcus aureus) 2010   right axilla   Myasthenia gravis (HCC)    Pectoralis muscle rupture     Past Surgical History:  Procedure Laterality Date   PECTORALIS TENDON REPAIR Right 08/12/2016   Procedure: RIGHT PECTORALIS TENDON REPAIR;  Surgeon: Josefina Chew, MD;  Location: [facility];  Service: Orthopedics;  Laterality: Right;   THYMECTOMY  1998    Current Medications: Current Meds  Medication Sig   metFORMIN  (GLUCOPHAGE ) 500 MG tablet Take 1 tablet (500 mg total) by mouth daily with breakfast.   mycophenolate  (CELLCEPT ) 250 MG capsule Take 1 capsule (250 mg total) by mouth 2 (two) times daily Take with 2- 500 mg tablets twice daily for total of 1250 mg twice daily   mycophenolate  (CELLCEPT ) 500 MG tablet Take 2 tablets (1,000 mg total) by mouth 2 (two) times daily. Take with 250 mg twice daily for total of 1250 mg twice daily   olmesartan  (BENICAR ) 20 MG tablet Take 1 tablet (20 mg total) by mouth daily.   pyridostigmine  (MESTINON ) 60 MG tablet Take 1.5 tablets (90 mg total) by mouth 4 (four) times daily as needed.   pyridostigmine  (MESTINON ) 60 MG tablet Take 1.5 tablets (90 mg  total) by mouth 4 (four) times daily. 5:30am, 12pm, 3pm, 6pm (prior to eating and driving)   VITAMIN D  PO Take by mouth.     Allergies:   Patient has no known allergies.   Social History   Socioeconomic History   Marital status: Married    Spouse name: Not on file   Number of children: Not on file   Years of education: Not on  file   Highest education level: Bachelor's degree (e.g., BA, AB, BS)  Occupational History   Not on file  Tobacco Use   Smoking status: Never   Smokeless tobacco: Never  Vaping Use   Vaping status: Never Used  Substance and Sexual Activity   Alcohol use: Not Currently    Comment: social   Drug use: Never   Sexual activity: Not Currently    Birth control/protection: Surgical  Other Topics Concern   Not on file  Social History Narrative   Not on file   Social Drivers of Health   Financial Resource Strain: Low Risk  (07/04/2023)   Overall Financial Resource Strain (CARDIA)    Difficulty of Paying Living Expenses: Not hard at all  Food Insecurity: No Food Insecurity (07/04/2023)   Hunger Vital Sign    Worried About Running Out of Food in the Last Year: Never true    Ran Out of Food in the Last Year: Never true  Transportation Needs: No Transportation Needs (07/04/2023)   PRAPARE - Administrator, Civil Service (Medical): No    Lack of Transportation (Non-Medical): No  Physical Activity: Insufficiently Active (07/04/2023)   Exercise Vital Sign    Days of Exercise per Week: 3 days    Minutes of Exercise per Session: 30 min  Stress: Stress Concern Present (07/04/2023)   [employer] of Occupational Health - Occupational Stress Questionnaire    Feeling of Stress : Very much  Social Connections: Moderately Integrated (07/04/2023)   Social Connection and Isolation Panel    Frequency of Communication with Friends and Family: More than three times a week    Frequency of Social Gatherings with Friends and Family: Twice a week    Attends Religious Services: More than 4 times per year    Active Member of [employer] or Organizations: No    Attends Engineer, Structural: Not on file    Marital Status: Married     Family History: The patient's family history includes Cancer in his maternal grandmother; Healthy in his mother; Other in his father. There is no history of  Colon cancer, Esophageal cancer, Stomach cancer, or Rectal cancer.  ROS:   Please see the history of present illness.     All other systems reviewed and are negative.  EKGs/Labs/Other Studies Reviewed:         Recent Labs: 01/11/2024: ALT 16; BUN 19; Creatinine, Ser 1.09; Hemoglobin 13.9; Platelets 273; Potassium 4.6; Sodium 138   Recent Lipid Panel    Component Value Date/Time   CHOL 144 01/11/2024 0932   TRIG 70 01/11/2024 0932   HDL 46 01/11/2024 0932   CHOLHDL 3.1 01/11/2024 0932   LDLCALC 84 01/11/2024 0932    Physical Exam:   VS:  BP (!) 160/91 (BP Location: Left Arm)   Pulse 80   Ht 5' 10 (1.778 m)   Wt 292 lb 12.8 oz (132.8 kg)   SpO2 97%   BMI 42.01 kg/m  , BMI Body mass index is 42.01 kg/m.  Vitals:   02/08/24 1512 02/08/24  1514  BP: (!) 164/94 (!) 160/91  Pulse: 80   Height: 5' 10 (1.778 m)   Weight: 292 lb 12.8 oz (132.8 kg)   SpO2: 97%   BMI (Calculated): 42.01     GENERAL:  Well appearing, overweight HEENT: Pupils equal round and reactive, fundi not visualized, oral mucosa unremarkable NECK:  No jugular venous distention, waveform within normal limits, carotid upstroke brisk and symmetric, no bruits, no thyromegaly LYMPHATICS:  No cervical adenopathy LUNGS:  Clear to auscultation bilaterally HEART:  RRR.  PMI not displaced or sustained,S1 and S2 within normal limits, no S3, no S4, no clicks, no rubs, no murmurs ABD:  Flat, positive bowel sounds normal in frequency in pitch, no bruits, no rebound, no guarding, no midline pulsatile mass, no hepatomegaly, no splenomegaly EXT:  2 plus pulses throughout, no edema, no cyanosis no clubbing SKIN:  No rashes no nodules NEURO:  Cranial nerves II through XII grossly intact, motor grossly intact throughout PSYCH:  Cognitively intact, oriented to person place and time   ASSESSMENT/PLAN:    Assessment & Plan Primary hypertension Hypertension managed with olmesartan , recent increase in blood pressure.  -  Increased olmesartan  to 40 mg daily. - Ordered blood work for secondary hypertension causes, including thyroid  function, hyperaldosteronism, and pheochromocytoma.BMET at that time for monitoring of renal function on increased dose Olmesartan .  - Discussed to monitor BP at home at least 2 hours after medications and sitting for 5-10 minutes.  - Schedule follow-up (in person or virtual) in 2-3 months to assess blood pressure control.  Ocular myasthenia gravis Diplopia present. Participating in [employer] financial study.  - Continue participation in [employer] financial study. - Avoid beta blockers and non-dihydropyridine calcium channel blockers. If needed in future could trial Amlodipine  for BP as dihydropyridine calcium channel blocker with neurology input.    Screening for Secondary Hypertension:     Relevant Labs/Studies:    Latest Ref Rng & Units 01/11/2024    9:32 AM 07/06/2023    4:01 PM 01/06/2023   11:37 AM  Basic Labs  Sodium 134 - 144 mmol/L 138  142  140   Potassium 3.5 - 5.2 mmol/L 4.6  4.3  4.6   Creatinine 0.76 - 1.27 mg/dL 8.90  8.88  8.75        Latest Ref Rng & Units 11/30/2018    4:01 PM 02/21/2018   10:10 AM  Thyroid    TSH 0.450 - 4.500 uIU/mL 1.850  2.010                  Disposition:    FU with MD/APP/PharmD in 2-3 months in person or virtual in [facility]    Medication Adjustments/Labs and Tests Ordered: Current medicines are reviewed at length with the patient today.  Concerns regarding medicines are outlined above.  No orders of the defined types were placed in this encounter.  No orders of the defined types were placed in this encounter.    Signed, Reche GORMAN Finder, NP  02/08/2024 3:51 PM    [facility]

## 2024-02-11 ENCOUNTER — Other Ambulatory Visit (HOSPITAL_BASED_OUTPATIENT_CLINIC_OR_DEPARTMENT_OTHER): Payer: Self-pay

## 2024-03-20 ENCOUNTER — Other Ambulatory Visit (HOSPITAL_BASED_OUTPATIENT_CLINIC_OR_DEPARTMENT_OTHER): Payer: Self-pay

## 2024-03-20 MED ORDER — MYCOPHENOLATE MOFETIL 500 MG PO TABS
1000.0000 mg | ORAL_TABLET | Freq: Two times a day (BID) | ORAL | 3 refills | Status: AC
  Filled 2024-03-20: qty 120, 30d supply, fill #0

## 2024-03-20 MED ORDER — PYRIDOSTIGMINE BROMIDE 60 MG PO TABS
90.0000 mg | ORAL_TABLET | Freq: Four times a day (QID) | ORAL | 3 refills | Status: AC
  Filled 2024-03-20: qty 180, 30d supply, fill #0

## 2024-03-20 MED ORDER — MYCOPHENOLATE MOFETIL 250 MG PO CAPS
250.0000 mg | ORAL_CAPSULE | Freq: Two times a day (BID) | ORAL | 3 refills | Status: AC
  Filled 2024-03-20: qty 60, 30d supply, fill #0

## 2024-03-28 ENCOUNTER — Other Ambulatory Visit (HOSPITAL_BASED_OUTPATIENT_CLINIC_OR_DEPARTMENT_OTHER): Payer: Self-pay

## 2024-03-28 ENCOUNTER — Encounter (HOSPITAL_BASED_OUTPATIENT_CLINIC_OR_DEPARTMENT_OTHER): Payer: Self-pay

## 2024-03-28 MED ORDER — HYDRALAZINE HCL 25 MG PO TABS
25.0000 mg | ORAL_TABLET | Freq: Two times a day (BID) | ORAL | 3 refills | Status: AC
  Filled 2024-03-28: qty 60, 30d supply, fill #0

## 2024-03-28 NOTE — Telephone Encounter (Signed)
 Called and reviewed recommendations with patient.  See mychart message for details.

## 2024-04-02 ENCOUNTER — Other Ambulatory Visit (HOSPITAL_BASED_OUTPATIENT_CLINIC_OR_DEPARTMENT_OTHER): Payer: Self-pay

## 2024-04-02 ENCOUNTER — Encounter: Payer: Self-pay | Admitting: Nurse Practitioner

## 2024-04-23 ENCOUNTER — Encounter (HOSPITAL_BASED_OUTPATIENT_CLINIC_OR_DEPARTMENT_OTHER): Admitting: Family

## 2024-07-11 ENCOUNTER — Ambulatory Visit: Admitting: Nurse Practitioner

## 2025-01-15 ENCOUNTER — Encounter: Admitting: Nurse Practitioner
